# Patient Record
Sex: Male | Born: 1990 | Race: Black or African American | Hispanic: No | Marital: Single | Smoking: Never smoker
Health system: Southern US, Community
[De-identification: ages and names within clinical notes are randomized; demographics above are authoritative.]

## PROBLEM LIST (undated history)

## (undated) HISTORY — PX: EXTERNAL EAR SURGERY: SHX627

---
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
## 2010-05-02 ENCOUNTER — Emergency Department (HOSPITAL_COMMUNITY)
Admission: EM | Admit: 2010-05-02 | Discharge: 2010-05-02 | Discharge status: Against Medical Advice | Payer: Self-pay | Attending: Emergency Medicine | Admitting: Emergency Medicine

## 2010-05-02 DIAGNOSIS — X58XXXA Exposure to other specified factors, initial encounter: Secondary | ICD-10-CM | POA: Insufficient documentation

## 2010-05-02 DIAGNOSIS — S0510XA Contusion of eyeball and orbital tissues, unspecified eye, initial encounter: Secondary | ICD-10-CM | POA: Insufficient documentation

## 2010-07-09 ENCOUNTER — Emergency Department (HOSPITAL_COMMUNITY)
Admission: EM | Admit: 2010-07-09 | Discharge: 2010-07-10 | Disposition: A | Discharge status: Home / Self Care | Payer: Self-pay | Attending: Emergency Medicine | Admitting: Emergency Medicine

## 2010-07-09 ENCOUNTER — Emergency Department (HOSPITAL_COMMUNITY): Payer: Self-pay

## 2010-07-09 DIAGNOSIS — S62309A Unspecified fracture of unspecified metacarpal bone, initial encounter for closed fracture: Secondary | ICD-10-CM | POA: Insufficient documentation

## 2010-07-09 DIAGNOSIS — W2209XA Striking against other stationary object, initial encounter: Secondary | ICD-10-CM | POA: Insufficient documentation

## 2010-07-09 DIAGNOSIS — M7989 Other specified soft tissue disorders: Secondary | ICD-10-CM | POA: Insufficient documentation

## 2016-03-22 ENCOUNTER — Encounter (HOSPITAL_COMMUNITY): Payer: Self-pay | Admitting: Emergency Medicine

## 2016-03-22 ENCOUNTER — Emergency Department (HOSPITAL_COMMUNITY)
Admission: EM | Admit: 2016-03-22 | Discharge: 2016-03-22 | Disposition: A | Discharge status: Home / Self Care | Payer: Self-pay | Attending: Emergency Medicine | Admitting: Emergency Medicine

## 2016-03-22 ENCOUNTER — Emergency Department (HOSPITAL_COMMUNITY): Payer: Self-pay

## 2016-03-22 DIAGNOSIS — S0990XA Unspecified injury of head, initial encounter: Secondary | ICD-10-CM | POA: Insufficient documentation

## 2016-03-22 DIAGNOSIS — Y999 Unspecified external cause status: Secondary | ICD-10-CM | POA: Insufficient documentation

## 2016-03-22 DIAGNOSIS — Y9389 Activity, other specified: Secondary | ICD-10-CM | POA: Insufficient documentation

## 2016-03-22 DIAGNOSIS — Y929 Unspecified place or not applicable: Secondary | ICD-10-CM | POA: Insufficient documentation

## 2016-03-22 DIAGNOSIS — Z87891 Personal history of nicotine dependence: Secondary | ICD-10-CM | POA: Insufficient documentation

## 2016-03-22 NOTE — Discharge Instructions (Signed)
Your neurologic exam is normal several hours after your minor head injury. This may be a minor concussive syndrome but no significant head injury is suspected. Return here if you have any worsening or new symptoms.
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
## 2016-04-02 ENCOUNTER — Emergency Department (HOSPITAL_BASED_OUTPATIENT_CLINIC_OR_DEPARTMENT_OTHER)
Admission: EM | Admit: 2016-04-02 | Discharge: 2016-04-02 | Disposition: A | Discharge status: Home / Self Care | Payer: Self-pay | Attending: Emergency Medicine | Admitting: Emergency Medicine

## 2016-04-02 ENCOUNTER — Emergency Department (HOSPITAL_BASED_OUTPATIENT_CLINIC_OR_DEPARTMENT_OTHER): Payer: Self-pay

## 2016-04-02 ENCOUNTER — Encounter (HOSPITAL_BASED_OUTPATIENT_CLINIC_OR_DEPARTMENT_OTHER): Payer: Self-pay | Admitting: Emergency Medicine

## 2016-04-02 DIAGNOSIS — Y9355 Activity, bike riding: Secondary | ICD-10-CM | POA: Insufficient documentation

## 2016-04-02 DIAGNOSIS — Y929 Unspecified place or not applicable: Secondary | ICD-10-CM | POA: Insufficient documentation

## 2016-04-02 DIAGNOSIS — W228XXA Striking against or struck by other objects, initial encounter: Secondary | ICD-10-CM | POA: Insufficient documentation

## 2016-04-02 DIAGNOSIS — Y999 Unspecified external cause status: Secondary | ICD-10-CM | POA: Insufficient documentation

## 2016-04-02 DIAGNOSIS — F17290 Nicotine dependence, other tobacco product, uncomplicated: Secondary | ICD-10-CM | POA: Insufficient documentation

## 2016-04-02 DIAGNOSIS — S93602A Unspecified sprain of left foot, initial encounter: Secondary | ICD-10-CM | POA: Insufficient documentation

## 2016-04-02 DIAGNOSIS — F17220 Nicotine dependence, chewing tobacco, uncomplicated: Secondary | ICD-10-CM | POA: Insufficient documentation

## 2016-04-02 MED ORDER — IBUPROFEN 600 MG PO TABS
600.0000 mg | ORAL_TABLET | Freq: Four times a day (QID) | ORAL | 0 refills | Status: DC | PRN
Start: 2016-04-02 — End: 2018-11-30

## 2016-04-02 MED ORDER — IBUPROFEN 400 MG PO TABS
600.0000 mg | ORAL_TABLET | Freq: Once | ORAL | Status: AC
  Administered 2016-04-02: 600 mg via ORAL
  Filled 2016-04-02: qty 1

## 2016-04-02 NOTE — ED Triage Notes (Signed)
Left foot pain x 2 days
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
## 2017-12-28 ENCOUNTER — Emergency Department (HOSPITAL_COMMUNITY)
Admission: EM | Admit: 2017-12-28 | Discharge: 2017-12-28 | Disposition: A | Discharge status: Home / Self Care | Payer: BLUE CROSS/BLUE SHIELD | Source: Home / Self Care | Attending: Emergency Medicine | Admitting: Emergency Medicine

## 2017-12-28 ENCOUNTER — Encounter (HOSPITAL_COMMUNITY): Payer: Self-pay

## 2017-12-28 ENCOUNTER — Other Ambulatory Visit: Payer: Self-pay

## 2017-12-28 ENCOUNTER — Emergency Department (HOSPITAL_COMMUNITY)
Admission: EM | Admit: 2017-12-28 | Discharge: 2017-12-28 | Discharge status: Against Medical Advice | Payer: BLUE CROSS/BLUE SHIELD | Attending: Emergency Medicine | Admitting: Emergency Medicine

## 2017-12-28 ENCOUNTER — Emergency Department (HOSPITAL_COMMUNITY): Payer: BLUE CROSS/BLUE SHIELD

## 2017-12-28 DIAGNOSIS — M545 Low back pain, unspecified: Secondary | ICD-10-CM

## 2017-12-28 DIAGNOSIS — F17290 Nicotine dependence, other tobacco product, uncomplicated: Secondary | ICD-10-CM | POA: Diagnosis not present

## 2017-12-28 DIAGNOSIS — Z5320 Procedure and treatment not carried out because of patient's decision for unspecified reasons: Secondary | ICD-10-CM | POA: Diagnosis not present

## 2017-12-28 MED ORDER — METHOCARBAMOL 500 MG PO TABS: 500.0000 mg | ORAL_TABLET | Freq: Two times a day (BID) | ORAL | 0 refills | Status: DC

## 2017-12-28 MED ORDER — METHOCARBAMOL 500 MG PO TABS
500.0000 mg | ORAL_TABLET | Freq: Once | ORAL | Status: AC
  Administered 2017-12-28: 500 mg via ORAL
  Filled 2017-12-28: qty 1

## 2017-12-28 MED ORDER — DICLOFENAC EPOLAMINE 1.3 % TD PTCH
1.0000 | MEDICATED_PATCH | Freq: Two times a day (BID) | TRANSDERMAL | Status: DC
  Filled 2017-12-28: qty 1

## 2017-12-28 MED ORDER — DICLOFENAC EPOLAMINE 1.3 % TD PTCH: 1.0000 | MEDICATED_PATCH | Freq: Two times a day (BID) | TRANSDERMAL | 0 refills | Status: DC

## 2017-12-28 MED ORDER — PREDNISONE 20 MG PO TABS
60.0000 mg | ORAL_TABLET | ORAL | Status: AC
  Administered 2017-12-28: 60 mg via ORAL
  Filled 2017-12-28: qty 3

## 2017-12-28 MED ORDER — DICLOFENAC EPOLAMINE 1.3 % TD PTCH
1.0000 | MEDICATED_PATCH | Freq: Two times a day (BID) | TRANSDERMAL | Status: DC
  Administered 2017-12-28: 1 via TRANSDERMAL

## 2017-12-28 NOTE — ED Provider Notes (Signed)
[place] EMERGENCY DEPARTMENT Provider Note   CSN: [id number] Arrival date & time: 12/28/17  1642     History   Chief Complaint Chief Complaint  Patient presents with  . Back Pain    HPI Travis Riley is a 27 y.o. male.  HPI  Sense with back pain. He notes that about 1 month ago he had onset, while lifting an object across a table in front of him that was particularly heavy. Since that time he has had pain persistently in his back, initially in the low back left, now throughout the lower back. Symptoms were initially mild, manageable with Tylenol, ibuprofen, but over the past few days in particular the pain is become severe, interfering with ambulation, activity. No abdominal pain, no fever, no incontinence.   History reviewed. No pertinent past medical history.  There are no active problems to display for this patient.   Past Surgical History:  Procedure Laterality Date  . EXTERNAL EAR SURGERY          Home Medications    Prior to Admission medications   Medication Sig Start Date End Date Taking? Authorizing Provider  ibuprofen (ADVIL,MOTRIN) 600 MG tablet Take 1 tablet (600 mg total) by mouth every 6 (six) hours as needed. 04/02/16   Julianne Rice, MD    Family History No family history on file.  Social History Social History   Tobacco Use  . Smoking status: Former Research scientist (life sciences)  . Smokeless tobacco: Current User    Types: Chew, Snuff  Substance Use Topics  . Alcohol use: No  . Drug use: No     Allergies   Patient has no allergy information on record.   Review of Systems Review of Systems  Constitutional:       Per HPI, otherwise negative  HENT:       Per HPI, otherwise negative  Respiratory:       Per HPI, otherwise negative  Cardiovascular:       Per HPI, otherwise negative  Gastrointestinal: Negative for vomiting.  Endocrine:       Negative aside from HPI  Genitourinary:       Neg aside from HPI     Musculoskeletal:       Per HPI, otherwise negative  Skin: Negative.   Neurological: Negative for syncope.     Physical Exam Updated Vital Signs BP 114/75   Pulse 62   Temp 98.4 F (36.9 C) (Oral)   Resp 16   Ht 6\' 1"  (1.854 m)   Wt 81.6 kg   SpO2 97%   BMI 23.75 kg/m   Physical Exam  Constitutional: He is oriented to person, place, and time. He appears well-developed. No distress.  HENT:  Head: Normocephalic and atraumatic.  Eyes: Conjunctivae and EOM are normal.  Cardiovascular: Normal rate and regular rhythm.  Pulmonary/Chest: Effort normal. No stridor. No respiratory distress.  Abdominal: He exhibits no distension.  Musculoskeletal: He exhibits no edema.  No appreciable deformity lower back tenderness is worse in the left lower back than right.  Neurological: He is alert and oriented to person, place, and time. He displays no atrophy and no tremor. He exhibits normal muscle tone. He displays no seizure activity. Coordination normal.  Skin: Skin is warm and dry.  Psychiatric: He has a normal mood and affect.  Nursing note and vitals reviewed.    ED Treatments / Results   Radiology No results found.  Procedures Procedures (including critical care time)  Medications Ordered  in ED Medications  diclofenac (FLECTOR) 1.3 % 1 patch (has no administration in time range)  predniSONE (DELTASONE) tablet 60 mg (60 mg Oral Given 12/28/17 1707)     Initial Impression / Assessment and Plan / ED Course  I have reviewed the triage vital signs and the nursing notes.  Pertinent labs & imaging results that were available during my care of the patient were reviewed by me and considered in my medical decision making (see chart for details).     7:07 PM Patient eloped prior to receiving his results. His physical exam was generally reassuring, there is low suspicion for life-threatening disease, but given his history of new back pain, x-ray was indicated.  On x-ray was  reassuring, patient likely has musculoskeletal strain He and I previously discussed appropriate management at home, but he left prior to receiving his discharge instructions or results from his x-ray.   Final Clinical Impressions(s) / ED Diagnoses  Acute left-sided low back pain without sciatica   Carmin Muskrat, MD 12/28/17 Einar Crow

## 2017-12-28 NOTE — ED Triage Notes (Signed)
Pt was put in ED lobby after xray earlier today and no staff was aware that this pt was there. Staff looked for pt and could not find him so he was discharged out as an elopement. Pt here for back pain x1 month

## 2017-12-28 NOTE — ED Provider Notes (Signed)
27 y.o. M who presented for evaluation of back pain x1 month.  He reports it began after he lifted an object that was heavy. Denies fevers, weight loss, numbness/weakness of upper and lower extremities, bowel/bladder incontinence, saddle anesthesia, history of back surgery, history of IVDA.   Patient had been seen earlier by previous provider.  He had gotten x-rays at that time but never returned back to his spot in the ER.  It was assumed that he had eloped from the ED.  Patient had actually been waiting in the lobby.  Please see note from previous provider for full history/physical.  I discussed x-ray results with patient.  Suspect this is most likely musculoskeletal in nature.  We will plan to send home with Robaxin and diclofenac patches for symptomatic relief.  Patient given [facility] for establishing primary care. Patient had ample opportunity for questions and discussion. All patient's questions were answered with full understanding. Strict return precautions discussed. Patient expresses understanding and agreement to plan.     Volanda Napoleon, PA-C 12/28/17 2215    Fredia Sorrow, MD 12/29/17 0002

## 2017-12-28 NOTE — Discharge Instructions (Signed)
You can take Tylenol or Ibuprofen as directed for pain. You can alternate Tylenol and Ibuprofen every 4 hours. If you take Tylenol at 1pm, then you can take Ibuprofen at 5pm. Then you can take Tylenol again at 9pm.   Take Robaxin as prescribed. This medication will make you drowsy so do not drive or drink alcohol when taking it.  Use the diclofenac patches as directed.  Follow-up with [facility] to establish a primary care doctor if you do not have one.   Return to the Emergency Department immediately for any worsening back pain, neck pain, difficulty walking, numbness/weaknss of your arms or legs, urinary or bowel accidents, fever or any other worsening or concerning symptoms.

## 2017-12-28 NOTE — ED Notes (Signed)
Pt eloped.

## 2017-12-28 NOTE — ED Notes (Signed)
Pt eloped, no where to be found

## 2017-12-28 NOTE — ED Triage Notes (Signed)
Pt reports 1 month lower back pain

## 2017-12-28 NOTE — ED Notes (Signed)
Patient transported to X-ray 

## 2018-11-21 ENCOUNTER — Emergency Department (HOSPITAL_COMMUNITY)
Admission: EM | Admit: 2018-11-21 | Discharge: 2018-11-21 | Discharge status: Against Medical Advice | Payer: BLUE CROSS/BLUE SHIELD

## 2018-11-21 NOTE — ED Notes (Signed)
Unable to locate patient at waiting area multiple times.

## 2018-11-26 ENCOUNTER — Emergency Department (HOSPITAL_COMMUNITY)
Admission: EM | Admit: 2018-11-26 | Discharge: 2018-11-26 | Disposition: A | Discharge status: Home / Self Care | Payer: No Typology Code available for payment source | Attending: Emergency Medicine | Admitting: Emergency Medicine

## 2018-11-26 ENCOUNTER — Other Ambulatory Visit: Payer: Self-pay

## 2018-11-26 ENCOUNTER — Encounter (HOSPITAL_COMMUNITY): Payer: Self-pay

## 2018-11-26 DIAGNOSIS — R2231 Localized swelling, mass and lump, right upper limb: Secondary | ICD-10-CM | POA: Diagnosis not present

## 2018-11-26 DIAGNOSIS — F17220 Nicotine dependence, chewing tobacco, uncomplicated: Secondary | ICD-10-CM | POA: Diagnosis not present

## 2018-11-26 NOTE — ED Triage Notes (Signed)
Pt states he thinks he got bit by something on his right arm 4 days ago. Pt states his right arm is numb and there is a lot of soreness in his right elbow.

## 2018-11-26 NOTE — ED Provider Notes (Signed)
[place] EMERGENCY DEPARTMENT Provider Note   CSN: [id number] Arrival date & time: 11/26/18  1639     History   Chief Complaint Chief Complaint  Patient presents with  . Insect Bite    HPI Travis Riley is a 28 y.o. male presents to the ER for evaluation of a nodule that he noticed on his right forearm 4 days ago.  States initially it was not tender but over the last couple of days it has developed local soreness.  Also reports entire hand is "numb".  States he cannot feel it at all.  He thinks it is related to his job.  He used to just drive a vehicle for work but over the last few weeks his boss has given him a new task and now he is doing heavy lifting and using his arms more frequently during the day.  He is at work today and told his boss that his arm was bothering him.  States his boss told him to "work through it".  He went to lunch and felt worse so he came to the ER for evaluation.  He is right-hand dominant.  Denies any trauma.  Denies IV drug use.  No associated redness, warmth or drainage.  No associated neck pain.  He is asking for 5 days off from work.    HPI  History reviewed. No pertinent past medical history.  There are no active problems to display for this patient.   Past Surgical History:  Procedure Laterality Date  . EXTERNAL EAR SURGERY          Home Medications    Prior to Admission medications   Medication Sig Start Date End Date Taking? Authorizing Provider  diclofenac (FLECTOR) 1.3 % PT[facility] Place 1 patch onto the skin 2 (two) times daily. 12/28/17   Volanda Napoleon, PA-C  ibuprofen (ADVIL,MOTRIN) 600 MG tablet Take 1 tablet (600 mg total) by mouth every 6 (six) hours as needed. 04/02/16   Julianne Rice, MD  methocarbamol (ROBAXIN) 500 MG tablet Take 1 tablet (500 mg total) by mouth 2 (two) times daily. 12/28/17   Volanda Napoleon, PA-C    Family History History reviewed. No pertinent family history.  Social History Social  History   Tobacco Use  . Smoking status: Former Research scientist (life sciences)  . Smokeless tobacco: Current User    Types: Chew, Snuff  Substance Use Topics  . Alcohol use: No  . Drug use: No     Allergies   Patient has no known allergies.   Review of Systems Review of Systems  Skin:       Nodule/lump  Neurological: Positive for numbness.  All other systems reviewed and are negative.    Physical Exam Updated Vital Signs BP 109/79   Pulse 61   Temp 98.2 F (36.8 C) (Oral)   Resp 16   Ht 6' (1.829 m)   Wt 77.1 kg   SpO2 99%   BMI 23.06 kg/m   Physical Exam Constitutional:      Appearance: He is well-developed.  HENT:     Head: Normocephalic.     Nose: Nose normal.  Eyes:     General: Lids are normal.  Neck:     Musculoskeletal: Normal range of motion.     Comments: No midline or paraspinal muscle tenderness.  Full range motion of the neck without pain.  No trapezius tenderness. Cardiovascular:     Rate and Rhythm: Normal rate.     Comments:  1+ radial pulses bilaterally. Pulmonary:     Effort: Pulmonary effort is normal. No respiratory distress.  Musculoskeletal: Normal range of motion.     Comments: Mild tenderness around the right medial epicondyle and space between the epicondyle and olecranon process.  No local erythema, edema, warmth to this area.  Full range of motion of the right wrist, elbow and shoulder without any deficit or pain.  Skin:    Comments: Approximately 2 x 2 cm rubbery, mildly tender easily mobile nodule on palmar aspect of mid right forearm.  No surrounding skin erythema, warmth, fluctuance.  Neurological:     Mental Status: He is alert.     Comments: 5/5 strength with right hand grip, wrist flexion/extension/adduction and abduction, elbow flexion and extension, shoulder abduction and flexion.  Sensation to light touch intact in right medial, ulnar, radial nerve distribution.  Patient gives inconsistent answers with once-two-point discrimination, question  effort.  Psychiatric:        Behavior: Behavior normal.      ED Treatments / Results  Labs (all labs ordered are listed, but only abnormal results are displayed) Labs Reviewed - No data to display  EKG None  Radiology No results found.  Procedures Ultrasound ED Soft Tissue  Date/Time: 11/26/2018 9:28 PM Performed by: Kinnie Feil, PA-C Authorized by: Kinnie Feil, PA-C   Procedure details:    Indications comment:  Nodule   Transverse view:  Visualized   Longitudinal view:  Visualized   Images: archived   Location:    Location: upper extremity     Side:  Right Findings:     no abscess present    no cellulitis present    no foreign body present   (including critical care time)  Medications Ordered in ED Medications - No data to display   Initial Impression / Assessment and Plan / ED Course  I have reviewed the triage vital signs and the nursing notes.  Pertinent labs & imaging results that were available during my care of the patient were reviewed by me and considered in my medical decision making (see chart for details).  Examination of nodule is benign, most consistent with likely lipoma versus cyst.  Bedside ultrasound shows approximately 2 x 1 cm fluid-filled oval lesion without surrounding cobblestoning/cellulitis.  Skin over the nodule is normal without signs of infection.  He denies IV drug use.  Exam is not consistent with abscess, cellulitis.  Compartments are soft.  Pulses and strength is intact.  Highest on DDX is benign nodule, this could be pressing on some distal/superficial nerves causing hand decreased sensation.  His strength is intact.  He reports increased use of right upper extremity and he has left medial elbow pain, distal nerve inflammation also possibility.  He has no associated neck pain.  Recommended symptom management with ice, rest, NSAIDs and activity modification.  Return precautions discussed.  Recommended outpatient follow-up  with general surgery orthopedics as needed.  Final Clinical Impressions(s) / ED Diagnoses   Final diagnoses:  Nodule of skin of right forearm    ED Discharge Orders    None       Arlean Hopping 11/26/18 2130    Quintella Reichert, MD 11/27/18 0030

## 2018-11-26 NOTE — Discharge Instructions (Signed)
You were seen in the ER for nodule in your forearm and decreased sensation in your hand.  Ultrasound showed a fluid-filled nodule but no signs of infection or abscess.  I suspect some of your decreased sensation in your hand may be to inflammation around the nerves in your forearm and elbow, it also could be from the nodule pressing on some nerves in the area.  Rest your right upper extremity over the next 48 to 72 hours.  Ice your elbow and wrist.  Take Tylenol or ibuprofen every 8 hours for the next 2 to 3 days to help with inflammation.  Ultimately, repeat arm and elbow movements can cause continued inflammation of your nerves.  Ideally, you can modify activities at work and at home and or ice as needed afterwards.  Return to the ER if there is complete loss of sensation or weakness to your extremity, redness swelling warmth pain fevers or drainage from the nodule.

## 2018-11-30 ENCOUNTER — Encounter (HOSPITAL_COMMUNITY): Payer: Self-pay | Admitting: *Deleted

## 2018-11-30 ENCOUNTER — Emergency Department (HOSPITAL_COMMUNITY): Payer: No Typology Code available for payment source

## 2018-11-30 ENCOUNTER — Emergency Department (HOSPITAL_COMMUNITY)
Admission: EM | Admit: 2018-11-30 | Discharge: 2018-11-30 | Disposition: A | Discharge status: Home / Self Care | Payer: No Typology Code available for payment source | Attending: Emergency Medicine | Admitting: Emergency Medicine

## 2018-11-30 ENCOUNTER — Other Ambulatory Visit: Payer: Self-pay

## 2018-11-30 DIAGNOSIS — M79631 Pain in right forearm: Secondary | ICD-10-CM | POA: Insufficient documentation

## 2018-11-30 DIAGNOSIS — R2231 Localized swelling, mass and lump, right upper limb: Secondary | ICD-10-CM | POA: Diagnosis not present

## 2018-11-30 MED ORDER — IBUPROFEN 400 MG PO TABS
400.0000 mg | ORAL_TABLET | Freq: Once | ORAL | Status: AC
  Administered 2018-11-30: 02:00:00 400 mg via ORAL
  Filled 2018-11-30: qty 1

## 2018-11-30 MED ORDER — HYDROCODONE-ACETAMINOPHEN 5-325 MG PO TABS: 1.0000 | ORAL_TABLET | Freq: Four times a day (QID) | ORAL | 0 refills | Status: DC | PRN

## 2018-11-30 NOTE — Discharge Instructions (Signed)
Be sure to take off the splint and range her wrist and elbow every day. Call the hand specialist today Use the pain medicine as needed.

## 2018-11-30 NOTE — ED Provider Notes (Signed)
[facility] EMERGENCY DEPARTMENT Provider Note   CSN: [id number] Arrival date & time: 11/30/18  0035     History   Chief Complaint Chief Complaint  Patient presents with  . Hand Pain    HPI Travis Riley is a 28 y.o. male.     The history is provided by the patient.  Hand Pain This is a new problem. The current episode started more than 1 week ago. The problem occurs daily. The problem has been gradually worsening. Pertinent negatives include no chest pain and no shortness of breath. The symptoms are aggravated by bending. Nothing relieves the symptoms. Treatments tried: Ibuprofen. The treatment provided no relief.  Patient presents for continued pain in right forearm and right hand.  Patient was seen in the ER at [facility] on November 6 He thought he had an insect bite to his forearm because he had a small nodule.  Since that time the pain has worsened and he has numbness from his elbow to his right hand.  No fevers or vomiting.  No traumatic injury.  He does use his hands a lot at work.   PMH-none Soc hx  - denies IVDA Past Surgical History:  Procedure Laterality Date  . EXTERNAL EAR SURGERY          Home Medications    Prior to Admission medications   Not on File    Family History History reviewed. No pertinent family history.  Social History Social History   Tobacco Use  . Smoking status: Former Research scientist (life sciences)  . Smokeless tobacco: Current User    Types: Chew, Snuff  Substance Use Topics  . Alcohol use: No  . Drug use: No     Allergies   Patient has no known allergies.   Review of Systems Review of Systems  Constitutional: Negative for fever.  Respiratory: Negative for shortness of breath.   Cardiovascular: Negative for chest pain.  Gastrointestinal: Negative for vomiting.  Musculoskeletal: Positive for myalgias. Negative for arthralgias.  Skin: Positive for wound.  Neurological: Positive for numbness.  All other systems reviewed and are  negative.    Physical Exam Updated Vital Signs BP 118/79 (BP Location: Left Arm)   Pulse 80   Temp 98.9 F (37.2 C) (Oral)   Resp 18   Ht 1.829 m (6')   Wt 77.1 kg   SpO2 98%   BMI 23.06 kg/m   Physical Exam CONSTITUTIONAL: Well developed/well nourished HEAD: Normocephalic/atraumatic EYES: EOMI ENMT: Mucous membranes moist NECK: supple no meningeal signs CV: S1/S2 noted, no murmurs/rubs/gallops noted LUNGS: Lungs are clear to auscultation bilaterally, no apparent distress ABDOMEN: soft NEURO: Pt is awake/alert/appropriate, moves all extremitiesx4.  No facial droop.   EXTREMITIES: pulses normal/equal, full ROM, mild tenderness noted over nodular area on right volar forearm.  There is no crepitus, no bruising, no erythema.  No fluctuance.  See photo below Patient has significant tenderness over the ulnar aspect of the right forearm.  Patient has tenderness throughout his right hand.  No significant edema noted extremity There is no streaking noted to the forearm. He reports due to pain he is unable to make a fist with his right hand There is no sensory deficit to the right upper extremity.  Distal pulses are equal intact. No crepitus SKIN: warm, color normal PSYCH: no abnormalities of mood noted, alert and oriented to situation    Patient gave verbal permission to utilize photo for medical documentation only The image was not stored on any personal  device   ED Treatments / Results  Labs (all labs ordered are listed, but only abnormal results are displayed) Labs Reviewed - No data to display  EKG None  Radiology Dg Forearm Right  Result Date: 11/30/2018 CLINICAL DATA:  Pain following heavy lifting, initial encounter EXAM: RIGHT FOREARM - 2 VIEW COMPARISON:  None. FINDINGS: There is no evidence of fracture or other focal bone lesions. Soft tissues are unremarkable. IMPRESSION: No acute abnormality noted. Electronically Signed   By: Inez Catalina M.D.   On: 11/30/2018  01:36   Dg Hand Complete Right  Result Date: 11/30/2018 CLINICAL DATA:  Hand numbness following heavy lifting, initial encounter EXAM: RIGHT HAND - COMPLETE 3+ VIEW COMPARISON:  07/09/2010 FINDINGS: Healed fifth metacarpal fracture is noted. No acute fracture or dislocation is seen. No soft tissue abnormality is noted. IMPRESSION: Healed fifth metacarpal fracture with angulation. No acute abnormality noted. Electronically Signed   By: Inez Catalina M.D.   On: 11/30/2018 01:38    Procedures Procedures   Medications Ordered in ED Medications  ibuprofen (ADVIL) tablet 400 mg (400 mg Oral Given 11/30/18 0138)     Initial Impression / Assessment and Plan / ED Course  I have reviewed the triage vital signs and the nursing notes.  Pertinent  imaging results that were available during my care of the patient were reviewed by me and considered in my medical decision making (see chart for details).        1:13 AM Patient presents for repeat ER visit due to continued pain in the right forearm.  Evaluation on November 6 revealed that this was likely a nodule, as ultrasound study did not reveal any signs of an abscess.  No obvious foreign body Felt to be nodule versus lipoma at that time. On My exam he appears to have pain out of proportion to exam Due to repeat visit, will obtain x-ray imaging of the forearm and hand. Patient will need prompt follow-up with hand surgeon 2:16 AM Patient stable.  Velcro splint applied.  X-rays negative for foreign body or bony injury No signs of any infectious etiology on exam. Head movement is limited due to pain, but no sensory deficit. Patient has adequate vascular supply to the extremities Patient be referred to hand surgery, will refer back to the one that was on-call in his initial visit on 11/6 Short course of pain medicine has been prescribed Final Clinical Impressions(s) / ED Diagnoses   Final diagnoses:  Right forearm pain  Nodule of skin of  right forearm    ED Discharge Orders    None       Ripley Fraise, MD 11/30/18 [phone]

## 2018-11-30 NOTE — ED Triage Notes (Signed)
Pt states he injured his right hand at work last week and has been having hand numbness; pt states he was seen at [facility] and told to see a hand surgeon but pt has not been able to find one and states the pain has gotten worse; pt has a lump on his right forearm

## 2019-05-27 ENCOUNTER — Other Ambulatory Visit: Payer: Self-pay | Admitting: Orthopedic Surgery

## 2019-05-27 DIAGNOSIS — R2231 Localized swelling, mass and lump, right upper limb: Secondary | ICD-10-CM

## 2019-06-22 ENCOUNTER — Ambulatory Visit
Admission: RE | Admit: 2019-06-22 | Discharge: 2019-06-22 | Disposition: A | Discharge status: Home / Self Care | Payer: No Typology Code available for payment source | Source: Ambulatory Visit | Attending: Orthopedic Surgery | Admitting: Orthopedic Surgery

## 2019-06-22 DIAGNOSIS — R2231 Localized swelling, mass and lump, right upper limb: Secondary | ICD-10-CM

## 2019-07-28 ENCOUNTER — Other Ambulatory Visit: Payer: Self-pay | Admitting: Orthopedic Surgery

## 2019-08-17 ENCOUNTER — Encounter (HOSPITAL_BASED_OUTPATIENT_CLINIC_OR_DEPARTMENT_OTHER): Payer: Self-pay | Admitting: Orthopedic Surgery

## 2019-08-17 ENCOUNTER — Other Ambulatory Visit: Payer: Self-pay

## 2019-08-22 ENCOUNTER — Other Ambulatory Visit (HOSPITAL_COMMUNITY): Admission: RE | Admit: 2019-08-22 | Payer: Self-pay | Source: Ambulatory Visit

## 2019-08-24 ENCOUNTER — Other Ambulatory Visit (HOSPITAL_COMMUNITY)
Admission: RE | Admit: 2019-08-24 | Discharge: 2019-08-24 | Disposition: A | Discharge status: Home / Self Care | Payer: HRSA Program | Source: Ambulatory Visit | Attending: Orthopedic Surgery | Admitting: Orthopedic Surgery

## 2019-08-24 DIAGNOSIS — Z01812 Encounter for preprocedural laboratory examination: Secondary | ICD-10-CM | POA: Diagnosis present

## 2019-08-24 DIAGNOSIS — Z20822 Contact with and (suspected) exposure to covid-19: Secondary | ICD-10-CM | POA: Diagnosis not present

## 2019-08-24 LAB — SARS CORONAVIRUS 2 (TAT 6-24 HRS): SARS Coronavirus 2: NEGATIVE

## 2019-08-25 ENCOUNTER — Encounter (HOSPITAL_BASED_OUTPATIENT_CLINIC_OR_DEPARTMENT_OTHER)
Admission: RE | Disposition: A | Discharge status: Home / Self Care | Payer: Self-pay | Source: Home / Self Care | Attending: Orthopedic Surgery

## 2019-08-25 ENCOUNTER — Encounter (HOSPITAL_BASED_OUTPATIENT_CLINIC_OR_DEPARTMENT_OTHER): Payer: Self-pay | Admitting: Orthopedic Surgery

## 2019-08-25 ENCOUNTER — Ambulatory Visit (HOSPITAL_BASED_OUTPATIENT_CLINIC_OR_DEPARTMENT_OTHER): Payer: No Typology Code available for payment source | Admitting: Anesthesiology

## 2019-08-25 ENCOUNTER — Ambulatory Visit (HOSPITAL_BASED_OUTPATIENT_CLINIC_OR_DEPARTMENT_OTHER)
Admission: RE | Admit: 2019-08-25 | Discharge: 2019-08-25 | Disposition: A | Discharge status: Home / Self Care | Payer: No Typology Code available for payment source | Attending: Orthopedic Surgery | Admitting: Orthopedic Surgery

## 2019-08-25 ENCOUNTER — Other Ambulatory Visit: Payer: Self-pay

## 2019-08-25 DIAGNOSIS — F17200 Nicotine dependence, unspecified, uncomplicated: Secondary | ICD-10-CM | POA: Insufficient documentation

## 2019-08-25 DIAGNOSIS — D2111 Benign neoplasm of connective and other soft tissue of right upper limb, including shoulder: Secondary | ICD-10-CM | POA: Insufficient documentation

## 2019-08-25 DIAGNOSIS — G5601 Carpal tunnel syndrome, right upper limb: Secondary | ICD-10-CM | POA: Diagnosis present

## 2019-08-25 DIAGNOSIS — G5621 Lesion of ulnar nerve, right upper limb: Secondary | ICD-10-CM | POA: Insufficient documentation

## 2019-08-25 HISTORY — PX: ULNAR NERVE TRANSPOSITION: SHX2595

## 2019-08-25 HISTORY — PX: CARPAL TUNNEL RELEASE: SHX101

## 2019-08-25 HISTORY — PX: MASS EXCISION: SHX2000

## 2019-08-25 SURGERY — EXCISION MASS
Anesthesia: Monitor Anesthesia Care | Site: Wrist | Laterality: Right

## 2019-08-25 MED ORDER — ONDANSETRON HCL 4 MG/2ML IJ SOLN
INTRAMUSCULAR | Status: AC
  Filled 2019-08-25: qty 2

## 2019-08-25 MED ORDER — FENTANYL CITRATE (PF) 100 MCG/2ML IJ SOLN
INTRAMUSCULAR | Status: AC
  Filled 2019-08-25: qty 2

## 2019-08-25 MED ORDER — MIDAZOLAM HCL 5 MG/5ML IJ SOLN
INTRAMUSCULAR | Status: DC | PRN
  Administered 2019-08-25: 1 mg via INTRAVENOUS

## 2019-08-25 MED ORDER — DEXAMETHASONE SODIUM PHOSPHATE 10 MG/ML IJ SOLN
INTRAMUSCULAR | Status: DC | PRN
Start: 2019-08-25 — End: 2019-08-25
  Administered 2019-08-25: 5 mg

## 2019-08-25 MED ORDER — MIDAZOLAM HCL 2 MG/2ML IJ SOLN
2.0000 mg | Freq: Once | INTRAMUSCULAR | Status: AC
  Administered 2019-08-25: 2 mg via INTRAVENOUS

## 2019-08-25 MED ORDER — SUCCINYLCHOLINE CHLORIDE 200 MG/10ML IV SOSY
PREFILLED_SYRINGE | INTRAVENOUS | Status: AC
  Filled 2019-08-25: qty 10

## 2019-08-25 MED ORDER — FENTANYL CITRATE (PF) 100 MCG/2ML IJ SOLN
100.0000 ug | Freq: Once | INTRAMUSCULAR | Status: AC
  Administered 2019-08-25: 100 ug via INTRAVENOUS

## 2019-08-25 MED ORDER — ACETAMINOPHEN 500 MG PO TABS
1000.0000 mg | ORAL_TABLET | Freq: Once | ORAL | Status: AC
  Administered 2019-08-25: 1000 mg via ORAL

## 2019-08-25 MED ORDER — ONDANSETRON HCL 4 MG/2ML IJ SOLN
INTRAMUSCULAR | Status: DC | PRN
  Administered 2019-08-25: 4 mg via INTRAVENOUS

## 2019-08-25 MED ORDER — LIDOCAINE 2% (20 MG/ML) 5 ML SYRINGE
INTRAMUSCULAR | Status: AC
  Filled 2019-08-25: qty 5

## 2019-08-25 MED ORDER — FENTANYL CITRATE (PF) 100 MCG/2ML IJ SOLN
INTRAMUSCULAR | Status: DC | PRN
  Administered 2019-08-25: 50 ug via INTRAVENOUS

## 2019-08-25 MED ORDER — HYDROCODONE-ACETAMINOPHEN 5-325 MG PO TABS: ORAL_TABLET | ORAL | 0 refills | Status: DC

## 2019-08-25 MED ORDER — DIPHENHYDRAMINE HCL 50 MG/ML IJ SOLN
INTRAMUSCULAR | Status: AC
  Filled 2019-08-25: qty 1

## 2019-08-25 MED ORDER — FENTANYL CITRATE (PF) 100 MCG/2ML IJ SOLN: 25.0000 ug | INTRAMUSCULAR | Status: DC | PRN

## 2019-08-25 MED ORDER — PROPOFOL 500 MG/50ML IV EMUL
INTRAVENOUS | Status: DC | PRN
  Administered 2019-08-25: 125 ug/kg/min via INTRAVENOUS

## 2019-08-25 MED ORDER — MIDAZOLAM HCL 2 MG/2ML IJ SOLN
INTRAMUSCULAR | Status: AC
  Filled 2019-08-25: qty 2

## 2019-08-25 MED ORDER — ACETAMINOPHEN 500 MG PO TABS
ORAL_TABLET | ORAL | Status: AC
  Filled 2019-08-25: qty 2

## 2019-08-25 MED ORDER — CEFAZOLIN SODIUM-DEXTROSE 2-4 GM/100ML-% IV SOLN
2.0000 g | INTRAVENOUS | Status: AC
  Administered 2019-08-25: 2 g via INTRAVENOUS

## 2019-08-25 MED ORDER — CEFAZOLIN SODIUM-DEXTROSE 2-4 GM/100ML-% IV SOLN
INTRAVENOUS | Status: AC
  Filled 2019-08-25: qty 100

## 2019-08-25 MED ORDER — LACTATED RINGERS IV SOLN: INTRAVENOUS | Status: DC

## 2019-08-25 MED ORDER — EPHEDRINE 5 MG/ML INJ
INTRAVENOUS | Status: AC
  Filled 2019-08-25: qty 10

## 2019-08-25 MED ORDER — DIPHENHYDRAMINE HCL 50 MG/ML IJ SOLN
INTRAMUSCULAR | Status: DC | PRN
Start: 2019-08-25 — End: 2019-08-25
  Administered 2019-08-25: 6.25 mg via INTRAVENOUS

## 2019-08-25 MED ORDER — ROPIVACAINE HCL 5 MG/ML IJ SOLN
INTRAMUSCULAR | Status: DC | PRN
  Administered 2019-08-25: 20 mL via PERINEURAL

## 2019-08-25 MED ORDER — PHENYLEPHRINE 40 MCG/ML (10ML) SYRINGE FOR IV PUSH (FOR BLOOD PRESSURE SUPPORT)
PREFILLED_SYRINGE | INTRAVENOUS | Status: AC
  Filled 2019-08-25: qty 10

## 2019-08-25 MED ORDER — PROPOFOL 500 MG/50ML IV EMUL
INTRAVENOUS | Status: AC
  Filled 2019-08-25: qty 50

## 2019-08-25 SURGICAL SUPPLY — 72 items
APL PRP STRL LF DISP 70% ISPRP (MISCELLANEOUS) ×3
APL SKNCLS STERI-STRIP NONHPOA (GAUZE/BANDAGES/DRESSINGS)
BENZOIN TINCTURE PRP APPL 2/3 (GAUZE/BANDAGES/DRESSINGS) IMPLANT
BLADE MINI RND TIP GREEN BEAV (BLADE) IMPLANT
BLADE SURG 15 STRL LF DISP TIS (BLADE) ×6 IMPLANT
BLADE SURG 15 STRL SS (BLADE) ×10
BNDG CMPR 9X4 STRL LF SNTH (GAUZE/BANDAGES/DRESSINGS) ×3
BNDG COHESIVE 1X5 TAN STRL LF (GAUZE/BANDAGES/DRESSINGS) IMPLANT
BNDG COHESIVE 2X5 TAN STRL LF (GAUZE/BANDAGES/DRESSINGS) IMPLANT
BNDG CONFORM 2 STRL LF (GAUZE/BANDAGES/DRESSINGS) IMPLANT
BNDG ELASTIC 2X5.8 VLCR STR LF (GAUZE/BANDAGES/DRESSINGS) IMPLANT
BNDG ELASTIC 3X5.8 VLCR STR LF (GAUZE/BANDAGES/DRESSINGS) ×10 IMPLANT
BNDG ELASTIC 4X5.8 VLCR STR LF (GAUZE/BANDAGES/DRESSINGS) ×5 IMPLANT
BNDG ESMARK 4X9 LF (GAUZE/BANDAGES/DRESSINGS) ×5 IMPLANT
BNDG GAUZE 1X2.1 STRL (MISCELLANEOUS) IMPLANT
BNDG GAUZE ELAST 4 BULKY (GAUZE/BANDAGES/DRESSINGS) ×5 IMPLANT
BNDG PLASTER X FAST 3X3 WHT LF (CAST SUPPLIES) IMPLANT
BNDG PLSTR 9X3 FST ST WHT (CAST SUPPLIES)
CHLORAPREP W/TINT 26 (MISCELLANEOUS) ×5 IMPLANT
CLOSURE WOUND 1/2 X4 (GAUZE/BANDAGES/DRESSINGS)
CORD BIPOLAR FORCEPS 12FT (ELECTRODE) ×5 IMPLANT
COVER BACK TABLE 60X90IN (DRAPES) ×5 IMPLANT
COVER MAYO STAND STRL (DRAPES) ×5 IMPLANT
COVER WAND RF STERILE (DRAPES) IMPLANT
CUFF TOURN SGL QUICK 18X3 (MISCELLANEOUS) ×5 IMPLANT
CUFF TOURN SGL QUICK 18X4 (TOURNIQUET CUFF) ×5 IMPLANT
DECANTER SPIKE VIAL GLASS SM (MISCELLANEOUS) IMPLANT
DRAPE EXTREMITY T 121X128X90 (DISPOSABLE) ×5 IMPLANT
DRAPE SURG 17X23 STRL (DRAPES) ×5 IMPLANT
DRSG PAD ABDOMINAL 8X10 ST (GAUZE/BANDAGES/DRESSINGS) ×5 IMPLANT
GAUZE 4X4 16PLY RFD (DISPOSABLE) IMPLANT
GAUZE SPONGE 4X4 12PLY STRL (GAUZE/BANDAGES/DRESSINGS) ×5 IMPLANT
GAUZE XEROFORM 1X8 LF (GAUZE/BANDAGES/DRESSINGS) ×5 IMPLANT
GLOVE BIO SURGEON STRL SZ7.5 (GLOVE) ×5 IMPLANT
GLOVE BIOGEL PI IND STRL 8 (GLOVE) ×3 IMPLANT
GLOVE BIOGEL PI IND STRL 8.5 (GLOVE) ×3 IMPLANT
GLOVE BIOGEL PI INDICATOR 8 (GLOVE) ×2
GLOVE BIOGEL PI INDICATOR 8.5 (GLOVE) ×2
GLOVE SURG ORTHO 8.0 STRL STRW (GLOVE) ×5 IMPLANT
GOWN STRL REUS W/ TWL LRG LVL3 (GOWN DISPOSABLE) ×3 IMPLANT
GOWN STRL REUS W/TWL LRG LVL3 (GOWN DISPOSABLE) ×5
GOWN STRL REUS W/TWL XL LVL3 (GOWN DISPOSABLE) ×10 IMPLANT
NDL HYPO 25X1 1.5 SAFETY (NEEDLE) ×2 IMPLANT
NEEDLE HYPO 25X1 1.5 SAFETY (NEEDLE) ×5 IMPLANT
NS IRRIG 1000ML POUR BTL (IV SOLUTION) ×5 IMPLANT
PACK BASIN DAY SURGERY FS (CUSTOM PROCEDURE TRAY) ×5 IMPLANT
PAD CAST 3X4 CTTN HI CHSV (CAST SUPPLIES) ×3 IMPLANT
PAD CAST 4YDX4 CTTN HI CHSV (CAST SUPPLIES) ×3 IMPLANT
PADDING CAST ABS 4INX4YD NS (CAST SUPPLIES) ×2
PADDING CAST ABS COTTON 4X4 ST (CAST SUPPLIES) ×3 IMPLANT
PADDING CAST COTTON 3X4 STRL (CAST SUPPLIES) ×5
PADDING CAST COTTON 4X4 STRL (CAST SUPPLIES) ×5
SLEEVE SCD COMPRESS KNEE MED (MISCELLANEOUS) ×5 IMPLANT
SPLINT FAST PLASTER 5X30 (CAST SUPPLIES)
SPLINT PLASTER CAST FAST 5X30 (CAST SUPPLIES) IMPLANT
SPLINT PLASTER CAST XFAST 3X15 (CAST SUPPLIES) IMPLANT
SPLINT PLASTER XTRA FASTSET 3X (CAST SUPPLIES)
STOCKINETTE 4X48 STRL (DRAPES) ×5 IMPLANT
STRIP CLOSURE SKIN 1/2X4 (GAUZE/BANDAGES/DRESSINGS) IMPLANT
SUT ETHILON 3 0 PS 1 (SUTURE) IMPLANT
SUT ETHILON 4 0 P 3 18 (SUTURE) ×5 IMPLANT
SUT ETHILON 4 0 PS 2 18 (SUTURE) ×11 IMPLANT
SUT ETHILON 5 0 P 3 18 (SUTURE)
SUT NYLON ETHILON 5-0 P-3 1X18 (SUTURE) IMPLANT
SUT VIC AB 2-0 SH 27 (SUTURE) ×5
SUT VIC AB 2-0 SH 27XBRD (SUTURE) ×3 IMPLANT
SUT VIC AB 4-0 P2 18 (SUTURE) IMPLANT
SUT VICRYL 4-0 PS2 18IN ABS (SUTURE) ×3 IMPLANT
SYR BULB EAR ULCER 3OZ GRN STR (SYRINGE) ×5 IMPLANT
SYR CONTROL 10ML LL (SYRINGE) ×5 IMPLANT
TOWEL GREEN STERILE FF (TOWEL DISPOSABLE) ×10 IMPLANT
UNDERPAD 30X36 HEAVY ABSORB (UNDERPADS AND DIAPERS) ×5 IMPLANT

## 2019-08-25 NOTE — Anesthesia Procedure Notes (Signed)
Anesthesia Regional Block: Supraclavicular block   Pre-Anesthetic Checklist: ,, timeout performed, Correct Patient, Correct Site, Correct Laterality, Correct Procedure, Correct Position, site marked, Risks and benefits discussed,  Surgical consent,  Pre-op evaluation,  At surgeon's request and post-op pain management  Laterality: Right  Prep: Maximum Sterile Barrier Precautions used, chloraprep       Needles:  Injection technique: Single-shot  Needle Type: Echogenic Stimulator Needle     Needle Length: 4cm  Needle Gauge: 22     Additional Needles:   Procedures:,,,, ultrasound used (permanent image in chart),,,,  Narrative:  Start time: 08/25/2019 10:07 AM End time: 08/25/2019 10:17 AM Injection made incrementally with aspirations every 5 mL.  Performed by: Personally  Anesthesiologist: Freddrick March, MD  Additional Notes: Monitors applied. No increased pain on injection. No increased resistance to injection. Injection made in 5cc increments. Good needle visualization. Patient tolerated procedure well.

## 2019-08-25 NOTE — H&P (Signed)
Travis Riley is an 29 y.o. male.   Chief Complaint: right hand numbness and forearm masses HPI: 29 yo male with numbness and tingling right hand and masses in the forearm.  Positive nerve conduction studies.  He wishes to have right carpal tunnel release and ulnar nerve decompression with possible transposition and removal of forearm masses.  Allergies: No Known Allergies  History reviewed. No pertinent past medical history.  Past Surgical History:  Procedure Laterality Date   EXTERNAL EAR SURGERY      Family History: History reviewed. No pertinent family history.  Social History:   reports that he has been smoking. His smokeless tobacco use includes chew and snuff. He reports that he does not drink alcohol and does not use drugs.  Medications: No medications prior to admission.    Results for orders placed or performed during the hospital encounter of 08/24/19 (from the past 48 hour(s))  SARS CORONAVIRUS 2 (TAT 6-24 HRS) Nasopharyngeal Nasopharyngeal Swab     Status: None   Collection Time: 08/24/19  9:00 AM   Specimen: Nasopharyngeal Swab  Result Value Ref Range   SARS Coronavirus 2 NEGATIVE NEGATIVE    Comment: (NOTE) SARS-CoV-2 target nucleic acids are NOT DETECTED.  The SARS-CoV-2 RNA is generally detectable in upper and lower respiratory specimens during the acute phase of infection. Negative results do not preclude SARS-CoV-2 infection, do not rule out co-infections with other pathogens, and should not be used as the sole basis for treatment or other patient management decisions. Negative results must be combined with clinical observations, patient history, and epidemiological information. The expected result is Negative.  Fact Sheet for Patients: SugarRoll.be  Fact Sheet for Healthcare Providers: https://www.woods-mathews.com/  This test is not yet approved or cleared by the [place] FDA and  has been authorized  for detection and/or diagnosis of SARS-CoV-2 by FDA under an Emergency Use Authorization (EUA). This EUA will remain  in effect (meaning this test can be used) for the duration of the COVID-19 declaration under Se ction 564(b)(1) of the Act, 21 U.S.C. section 360bbb-3(b)(1), unless the authorization is terminated or revoked sooner.  Performed at [facility], [place] [address], [place], [place] [address]     No results found.   A comprehensive review of systems was negative.  Height 6\' 2"  (1.88 m), weight 81.6 kg.  General appearance: alert, cooperative and appears stated age Head: Normocephalic, without obvious abnormality, atraumatic Neck: supple, symmetrical, trachea midline Cardio: regular rate and rhythm Resp: clear to auscultation bilaterally Extremities: Intact sensation and capillary refill all digits.  +epl/fpl/io.  No wounds.  Pulses: 2+ and symmetric Skin: Skin color, texture, turgor normal. No rashes or lesions Neurologic: Grossly normal Incision/Wound: none  Assessment/Plan Right carpal tunnel syndrome, ulnar nerve compression at elbow, forearm masses x 2.  Non operative and operative treatment options have been discussed with the patient and patient wishes to proceed with operative treatment. Risks, benefits, and alternatives of surgery have been discussed and the patient agrees with the plan of care.   Leanora Cover 08/25/2019, 8:38 AM

## 2019-08-25 NOTE — Op Note (Signed)
NAME: Travis Riley MEDICAL RECORD NO: [id number] DATE OF BIRTH: [birth year] FACILITY: [facility] LOCATION: [facility] PHYSICIAN: Tennis Must, MD   OPERATIVE REPORT   DATE OF PROCEDURE: 08/25/19    PREOPERATIVE DIAGNOSIS:   Right carpal tunnel syndrome, right ulnar nerve compression at the elbow, right forearm mass   POSTOPERATIVE DIAGNOSIS:  Right carpal tunnel syndrome, right ulnar nerve compression at the elbow, right forearm mass   PROCEDURE:   1.  Right carpal tunnel release 2.  Right ulnar nerve decompression at the elbow 3.  Excision of fascial mass right forearm 1 cm   SURGEON:  Leanora Cover, M.D.   ASSISTANT: Daryll Brod, MD   ANESTHESIA:  Regional with sedation   INTRAVENOUS FLUIDS:  Per anesthesia flow sheet.   ESTIMATED BLOOD LOSS:  Minimal.   COMPLICATIONS:  None.   SPECIMENS:   Right forearm mass to pathology   TOURNIQUET TIME:   Right arm: 51 minutes at 250 mmHg   DISPOSITION:  Stable to PACU.   INDICATIONS: 29 year old male with numbness and tingling in the right hand.  He has positive nerve conduction studies.  He wishes to have right carpal tunnel release and ulnar nerve decompression at the elbow.  He also has a mass in the volar forearm that is painful and bothersome.  He wishes to have this removed. Risks, benefits and alternatives of surgery were discussed including the risks of blood loss, infection, damage to nerves, vessels, tendons, ligaments, bone for surgery, need for additional surgery, complications with wound healing, continued pain, stiffness.  He voiced understanding of these risks and elected to proceed.  OPERATIVE COURSE:  After being identified preoperatively by myself,  the patient and I agreed on the procedure and site of the procedure.  The surgical site was marked.  Surgical consent had been signed. He was given IV antibiotics as preoperative antibiotic prophylaxis. He was transferred to the operating room and placed on  the operating table in supine position with the Right upper extremity on an arm board.  Sedation was induced by the anesthesiologist. A regional block had been performed by anesthesia in preoperative holding.   Right upper extremity was prepped and draped in normal sterile orthopedic fashion.  A surgical pause was performed between the surgeons, anesthesia, and operating room staff and all were in agreement as to the patient, procedure, and site of procedure.  Tourniquet at the proximal aspect of the extremity was inflated to 250 mmHg after exsanguination of the arm with an Esmarch bandage.   Incision was made over the transverse carpal ligament and carried into the subcutaneous tissues by spreading technique.  Bipolar electrocautery was used to obtain hemostasis.  The palmar fascia was sharply incised.  The transverse carpal ligament was identified and sharply incised.  It was incised distally first.  The flexor tendons were identified.  The flexor tendon to the ring finger was identified and retracted radially.  The transverse carpal ligament was then incised proximally.  Scissors were used to split the distal aspect of the volar antebrachial fascia.  A finger was placed into the wound to ensure complete decompression, which was the case.  The nerve was examined.  It was flattened and hyperemic.  The motor branch was identified and was intact.  The wound was copiously irrigated with sterile saline.  It was then closed with 4-0 nylon in a horizontal mattress fashion.  Incision was then made at the medial side of the elbow.  This is carried  in subcutaneous tissues by spreading technique.  Bipolar electrocautery was used to obtain hemostasis.  Cutaneous branches of nerve were identified and protected throughout the case.  The ulnar nerve was identified proximal to Osborne's ligament.  It was then carefully freed up and protected while Osborne's ligament was divided.  The fascia over the FCU muscle was divided and  the FCU heads spread.  The investing fascia surrounding nerve was released.  The motor branch to the FCU muscle was identified and protected.  There was good decompression of the nerve distally.  It was then decompressed proximally again with both the muscular fascia and investing fascia surrounding nerve.  The elbow was flexed.  The nerve did not subluxate out of the groove.  The wound was copiously irrigated with sterile saline.  The anterior flap of Osborne's ligament was repaired to the posterior skin flap subcutaneous tissues to provide a bolster.  There was no compression over the nerve.  Inverted interrupted 4-0 Vicryl sutures were placed in subcutaneous tissues and skin was closed with 4-0 nylon in a horizontal mattress fashion.  Incision was then made over the mass in the volar forearm.  This is carried into subcutaneous tissues by spreading technique.  Bipolar electrocautery was used to obtain hemostasis.  The mass was identified.  It was within the fascia.  The lateral antebrachial cutaneous nerve was adherent to its undersurface.  This nerve was carefully freed up and protected.  The mass was removed.  It was approximately 1 cm in diameter.  It was sent to pathology for examination.  The area was palpated no mass was remained.  The wound was copiously irrigated with sterile saline.  Inverted interrupted Vicryl sutures were placed in subcutaneous tissues and skin was closed with 4-0 nylon in a horizontal mattress fashion.  The wounds were all dressed with sterile Xeroform 4 x 4's and an ABD at the wrist.  These were wrapped with Kerlix and Ace bandage.  The tourniquet was deflated at 51 minutes.  Fingertips were pink with brisk capillary refill after deflation of tourniquet.  The operative  drapes were broken down.  The patient was awoken from anesthesia safely.  He was transferred back to the stretcher and taken to PACU in stable condition.  I will see him back in the office in 1 week for postoperative  followup.  I will give him a prescription for Norco 5/325 1-2 tabs PO q6 hours prn pain, dispense # 20.   Leanora Cover, MD Electronically signed, 08/25/19

## 2019-08-25 NOTE — Discharge Instructions (Addendum)
[facility] Center Instructions Hand Surgery  Wound Care: Keep your hand elevated above the level of your heart.  Do not allow it to dangle by your side.  Keep the dressing dry and do not remove it unless your doctor advises you to do so.  He will usually change it at the time of your post-op visit.  Moving your fingers is advised to stimulate circulation but will depend on the site of your surgery.  If you have a splint applied, your doctor will advise you regarding movement.  Activity: Do not drive or operate machinery today.  Rest today and then you may return to your normal activity and work as indicated by your physician.  Diet:  Drink liquids today or eat a light diet.  You may resume a regular diet tomorrow.    General expectations: Pain for two to three days. Fingers may become slightly swollen.  Call your doctor if any of the following occur: Severe pain not relieved by pain medication. Elevated temperature. Dressing soaked with blood. Inability to move fingers. White or bluish color to fingers.  No Tylenol until 4:00pm if needed.   Post Anesthesia Home Care Instructions  Activity: Get plenty of rest for the remainder of the day. A responsible individual must stay with you for 24 hours following the procedure.  For the next 24 hours, DO NOT: -Drive a car -Paediatric nurse -Drink alcoholic beverages -Take any medication unless instructed by your physician -Make any legal decisions or sign important papers.  Meals: Start with liquid foods such as gelatin or soup. Progress to regular foods as tolerated. Avoid greasy, spicy, heavy foods. If nausea and/or vomiting occur, drink only clear liquids until the nausea and/or vomiting subsides. Call your physician if vomiting continues.  Special Instructions/Symptoms: Your throat may feel dry or sore from the anesthesia or the breathing tube placed in your throat during surgery. If this causes discomfort, gargle with warm salt water.  The discomfort should disappear within 24 hours.  If you had a scopolamine patch placed behind your ear for the management of post- operative nausea and/or vomiting:  1. The medication in the patch is effective for 72 hours, after which it should be removed.  Wrap patch in a tissue and discard in the trash. Wash hands thoroughly with soap and water. 2. You may remove the patch earlier than 72 hours if you experience unpleasant side effects which may include dry mouth, dizziness or visual disturbances. 3. Avoid touching the patch. Wash your hands with soap and water after contact with the patch.    Regional Anesthesia Blocks  1. Numbness or the inability to move the "blocked" extremity may last from 3-48 hours after placement. The length of time depends on the medication injected and your individual response to the medication. If the numbness is not going away after 48 hours, call your surgeon.  2. The extremity that is blocked will need to be protected until the numbness is gone and the  Strength has returned. Because you cannot feel it, you will need to take extra care to avoid injury. Because it may be weak, you may have difficulty moving it or using it. You may not know what position it is in without looking at it while the block is in effect.  3. For blocks in the legs and feet, returning to weight bearing and walking needs to be done carefully. You will need to wait until the numbness is entirely gone and the strength has returned. You  should be able to move your leg and foot normally before you try and bear weight or walk. You will need someone to be with you when you first try to ensure you do not fall and possibly risk injury.  4. Bruising and tenderness at the needle site are common side effects and will resolve in a few days.  5. Persistent numbness or new problems with movement should be communicated to the surgeon or the [facility] [phone] [place] [phone]).

## 2019-08-25 NOTE — Anesthesia Preprocedure Evaluation (Addendum)
Anesthesia Evaluation  Patient identified by MRN, date of birth, ID band Patient awake    Reviewed: Allergy & Precautions, NPO status , Patient's Chart, lab work & pertinent test results  Airway Mallampati: I  TM Distance: >3 FB Neck ROM: Full    Dental no notable dental hx. (+) Teeth Intact, Dental Advisory Given   Pulmonary neg pulmonary ROS, Current Smoker and Patient abstained from smoking.,    Pulmonary exam normal breath sounds clear to auscultation       Cardiovascular negative cardio ROS Normal cardiovascular exam Rhythm:Regular Rate:Normal     Neuro/Psych negative neurological ROS  negative psych ROS   GI/Hepatic negative GI ROS, Neg liver ROS,   Endo/Other  negative endocrine ROS  Renal/GU negative Renal ROS  negative genitourinary   Musculoskeletal negative musculoskeletal ROS (+)   Abdominal   Peds  Hematology negative hematology ROS (+)   Anesthesia Other Findings   Reproductive/Obstetrics                            Anesthesia Physical Anesthesia Plan  ASA: II  Anesthesia Plan: MAC and Regional   Post-op Pain Management:  Regional for Post-op pain   Induction: Intravenous  PONV Risk Score and Plan: 1 and Propofol infusion, Treatment may vary due to age or medical condition, Midazolam and Ondansetron  Airway Management Planned: Natural Airway  Additional Equipment:   Intra-op Plan:   Post-operative Plan:   Informed Consent: I have reviewed the patients History and Physical, chart, labs and discussed the procedure including the risks, benefits and alternatives for the proposed anesthesia with the patient or authorized representative who has indicated his/her understanding and acceptance.     Dental advisory given  Plan Discussed with: CRNA  Anesthesia Plan Comments:         Anesthesia Quick Evaluation

## 2019-08-25 NOTE — Op Note (Signed)
I assisted Surgeon(s) and Role:    * Leanora Cover, MD - Primary    * Daryll Brod, MD on the Procedure(s): EXCISION MASS RIGHT FOREARM X2 CARPAL TUNNEL RELEASE ULNAR NERVE DECOMPRESSION AT [facility] on 08/25/2019.  I provided assistance on this case as follows: Set up, approach release of the median nerve at the wrist set up, approach identification decompression of the ulnar nerve at the elbow with retraction proximally and distally  with the retractors for extensive decompression, creation of retaining fascial flap closure of the wound, excision of the mass with closure of the wound and application of the dressings.   Electronically signed by: Daryll Brod, MD Date: 08/25/2019 Time: 11:44 AM

## 2019-08-25 NOTE — Progress Notes (Signed)
Assisted Dr. Woodrum with right, ultrasound guided, supraclavicular block. Side rails up, monitors on throughout procedure. See vital signs in flow sheet. Tolerated Procedure well. 

## 2019-08-25 NOTE — Transfer of Care (Signed)
Immediate Anesthesia Transfer of Care Note  Patient: ELIBERTO SOLE  Procedure(s) Performed: EXCISION MASS RIGHT FOREARM X2 (Right Arm Lower) CARPAL TUNNEL RELEASE (Right Wrist) ULNAR NERVE DECOMPRESSION AT THE ELBOW POSSIBLETRANSPOSITION (Right Elbow)  Patient Location: PACU  Anesthesia Type:MAC combined with regional for post-op pain  Level of Consciousness: sedated  Airway & Oxygen Therapy: Patient Spontanous Breathing and Patient connected to nasal cannula oxygen  Post-op Assessment: Report given to RN and Post -op Vital signs reviewed and stable  Post vital signs: Reviewed and stable  Last Vitals:  Vitals Value Taken Time  BP    Temp    Pulse    Resp    SpO2      Last Pain:  Vitals:   08/25/19 0951  TempSrc: Oral  PainSc: 6       Patients Stated Pain Goal: 5 (97/94/80 1655)  Complications: No complications documented.

## 2019-08-25 NOTE — Anesthesia Postprocedure Evaluation (Signed)
Anesthesia Post Note  Patient: Travis Riley  Procedure(s) Performed: EXCISION MASS RIGHT FOREARM X2 (Right Arm Lower) CARPAL TUNNEL RELEASE (Right Wrist) ULNAR NERVE DECOMPRESSION AT THE ELBOW POSSIBLETRANSPOSITION (Right Elbow)     Patient location during evaluation: PACU Anesthesia Type: Regional and MAC Level of consciousness: awake and alert Pain management: pain level controlled Vital Signs Assessment: post-procedure vital signs reviewed and stable Respiratory status: spontaneous breathing, nonlabored ventilation, respiratory function stable and patient connected to nasal cannula oxygen Cardiovascular status: stable and blood pressure returned to baseline Postop Assessment: no apparent nausea or vomiting Anesthetic complications: no   No complications documented.  Last Vitals:  Vitals:   08/25/19 1240 08/25/19 1242  BP:  109/78  Pulse: 65 (!) 48  Resp: 16 18  Temp:  36.6 C  SpO2: 100% 100%    Last Pain:  Vitals:   08/25/19 1242  TempSrc: Oral  PainSc: 0-No pain                 [name]

## 2019-08-26 ENCOUNTER — Encounter (HOSPITAL_BASED_OUTPATIENT_CLINIC_OR_DEPARTMENT_OTHER): Payer: Self-pay | Admitting: Orthopedic Surgery

## 2019-08-30 LAB — SURGICAL PATHOLOGY

## 2019-09-01 ENCOUNTER — Encounter (HOSPITAL_BASED_OUTPATIENT_CLINIC_OR_DEPARTMENT_OTHER): Payer: Self-pay | Admitting: Orthopedic Surgery

## 2020-04-18 ENCOUNTER — Encounter (HOSPITAL_COMMUNITY): Payer: Self-pay | Admitting: Emergency Medicine

## 2020-04-18 ENCOUNTER — Emergency Department (HOSPITAL_COMMUNITY)
Admission: EM | Admit: 2020-04-18 | Discharge: 2020-04-19 | Disposition: A | Discharge status: Home / Self Care | Payer: BC Managed Care – PPO | Attending: Emergency Medicine | Admitting: Emergency Medicine

## 2020-04-18 ENCOUNTER — Other Ambulatory Visit: Payer: Self-pay

## 2020-04-18 DIAGNOSIS — R1030 Lower abdominal pain, unspecified: Secondary | ICD-10-CM | POA: Diagnosis present

## 2020-04-18 DIAGNOSIS — A084 Viral intestinal infection, unspecified: Secondary | ICD-10-CM

## 2020-04-18 DIAGNOSIS — R824 Acetonuria: Secondary | ICD-10-CM | POA: Diagnosis not present

## 2020-04-18 DIAGNOSIS — E871 Hypo-osmolality and hyponatremia: Secondary | ICD-10-CM

## 2020-04-18 LAB — CBC WITH DIFFERENTIAL/PLATELET
Abs Immature Granulocytes: 0.01 10*3/uL (ref 0.00–0.07)
Basophils Absolute: 0 10*3/uL (ref 0.0–0.1)
Basophils Relative: 1 %
Eosinophils Absolute: 0.1 10*3/uL (ref 0.0–0.5)
Eosinophils Relative: 2 %
HCT: 44.5 % (ref 39.0–52.0)
Hemoglobin: 15.2 g/dL (ref 13.0–17.0)
Immature Granulocytes: 0 %
Lymphocytes Relative: 20 %
Lymphs Abs: 1 10*3/uL (ref 0.7–4.0)
MCH: 31.7 pg (ref 26.0–34.0)
MCHC: 34.2 g/dL (ref 30.0–36.0)
MCV: 92.7 fL (ref 80.0–100.0)
Monocytes Absolute: 0.4 10*3/uL (ref 0.1–1.0)
Monocytes Relative: 9 %
Neutro Abs: 3.2 10*3/uL (ref 1.7–7.7)
Neutrophils Relative %: 68 %
Platelets: 206 10*3/uL (ref 150–400)
RBC: 4.8 MIL/uL (ref 4.22–5.81)
RDW: 11.4 % — ABNORMAL LOW (ref 11.5–15.5)
WBC: 4.7 10*3/uL (ref 4.0–10.5)
nRBC: 0 % (ref 0.0–0.2)

## 2020-04-18 LAB — COMPREHENSIVE METABOLIC PANEL
ALT: 18 U/L (ref 0–44)
AST: 19 U/L (ref 15–41)
Albumin: 4.1 g/dL (ref 3.5–5.0)
Alkaline Phosphatase: 47 U/L (ref 38–126)
Anion gap: 8 (ref 5–15)
BUN: 13 mg/dL (ref 6–20)
CO2: 25 mmol/L (ref 22–32)
Calcium: 9.2 mg/dL (ref 8.9–10.3)
Chloride: 101 mmol/L (ref 98–111)
Creatinine, Ser: 0.86 mg/dL (ref 0.61–1.24)
GFR, Estimated: >60 mL/min (ref >60)
Glucose, Bld: 107 mg/dL — ABNORMAL HIGH (ref 70–99)
Potassium: 3.5 mmol/L (ref 3.5–5.1)
Sodium: 134 mmol/L — ABNORMAL LOW (ref 135–145)
Total Bilirubin: 0.7 mg/dL (ref 0.3–1.2)
Total Protein: 7.1 g/dL (ref 6.5–8.1)

## 2020-04-18 LAB — LIPASE, BLOOD: Lipase: 22 U/L (ref 11–51)

## 2020-04-18 NOTE — ED Triage Notes (Signed)
Pt to the ED with abdominal pain and emesis with blood since last night.  Pt took an at home test for Covid and it was negative.

## 2020-04-18 NOTE — ED Provider Notes (Signed)
MSE was initiated and I personally evaluated the patient and placed orders (if any) at  10:24 PM on April 18, 2020.  Patient presented to ED with complaints of abdominal pain body aches.  Patient states he also started to have blood-tinged emesis.  This concerned him so he came to the ED.  On exam patient has mild abdominal discomfort.  No rebound or guarding.  Patient still needs to have vital signs.    Dorie Rank, MD 04/18/20 2225

## 2020-04-19 ENCOUNTER — Encounter (HOSPITAL_COMMUNITY): Payer: Self-pay | Admitting: Radiology

## 2020-04-19 ENCOUNTER — Emergency Department (HOSPITAL_COMMUNITY): Payer: BC Managed Care – PPO

## 2020-04-19 LAB — URINALYSIS, ROUTINE W REFLEX MICROSCOPIC
Bilirubin Urine: NEGATIVE
Glucose, UA: NEGATIVE mg/dL
Hgb urine dipstick: NEGATIVE
Ketones, ur: 20 mg/dL — AB
Leukocytes,Ua: NEGATIVE
Nitrite: NEGATIVE
Protein, ur: NEGATIVE mg/dL
Specific Gravity, Urine: 1.023 (ref 1.005–1.030)
pH: 6 (ref 5.0–8.0)

## 2020-04-19 MED ORDER — IOHEXOL 300 MG/ML  SOLN
100.0000 mL | Freq: Once | INTRAMUSCULAR | Status: AC | PRN
  Administered 2020-04-19: 100 mL via INTRAVENOUS

## 2020-04-19 MED ORDER — ONDANSETRON HCL 4 MG PO TABS: 4.0000 mg | ORAL_TABLET | Freq: Four times a day (QID) | ORAL | 0 refills | Status: AC | PRN

## 2020-04-19 MED ORDER — ONDANSETRON HCL 4 MG/2ML IJ SOLN
4.0000 mg | Freq: Once | INTRAMUSCULAR | Status: AC
  Administered 2020-04-19: 4 mg via INTRAVENOUS
  Filled 2020-04-19: qty 2

## 2020-04-19 MED ORDER — MORPHINE SULFATE (PF) 4 MG/ML IV SOLN
4.0000 mg | Freq: Once | INTRAVENOUS | Status: AC
  Administered 2020-04-19: 4 mg via INTRAVENOUS
  Filled 2020-04-19: qty 1

## 2020-04-19 MED ORDER — LOPERAMIDE HCL 2 MG PO CAPS
4.0000 mg | ORAL_CAPSULE | Freq: Once | ORAL | Status: AC
  Administered 2020-04-19: 4 mg via ORAL
  Filled 2020-04-19: qty 2

## 2020-04-19 MED ORDER — LACTATED RINGERS IV BOLUS
1000.0000 mL | Freq: Once | INTRAVENOUS | Status: AC
  Administered 2020-04-19: 1000 mL via INTRAVENOUS

## 2020-04-19 NOTE — ED Provider Notes (Signed)
[facility] EMERGENCY DEPARTMENT Provider Note   CSN: [id number] Arrival date & time: 04/18/20  2043   History Chief Complaint  Patient presents with  . Abdominal Pain    Travis Riley is a 30 y.o. male.  The history is provided by the patient.  Abdominal Pain He had onset 2 days ago pain across the lower abdomen with some radiation to the back.  Pain generally comes in waves.  Nothing seems to make it better, nothing makes it worse.  Yesterday, he started having episodes of emesis.  Tonight, he had some blood mixed with his emesis.  He denies fever but has had chills and sweats.  He denies any urinary difficulty.  Pain is currently rated at 8/10, but it has been as severe as 10/10.  He has never had similar symptoms before.  History reviewed. No pertinent past medical history.  There are no problems to display for this patient.   Past Surgical History:  Procedure Laterality Date  . CARPAL TUNNEL RELEASE Right 08/25/2019   Procedure: CARPAL TUNNEL RELEASE;  Surgeon: Leanora Cover, MD;  Location: [facility];  Service: Orthopedics;  Laterality: Right;  . EXTERNAL EAR SURGERY    . MASS EXCISION Right 08/25/2019   Procedure: EXCISION MASS RIGHT FOREARM X1;  Surgeon: Leanora Cover, MD;  Location: [place];  Service: Orthopedics;  Laterality: Right;  . ULNAR NERVE TRANSPOSITION Right 08/25/2019   Procedure: ULNAR NERVE DECOMPRESSION AT THE ELBOW;  Surgeon: Leanora Cover, MD;  Location: [place];  Service: Orthopedics;  Laterality: Right;       History reviewed. No pertinent family history.  Social History   Tobacco Use  . Smoking status: Never Smoker  . Smokeless tobacco: Former Network engineer  . Vaping Use: Never used  Substance Use Topics  . Alcohol use: No  . Drug use: Yes    Types: Marijuana    Comment: daily    Home Medications Prior to Admission medications   Medication Sig Start Date End Date Taking? Authorizing  Provider  HYDROcodone-acetaminophen [facility]) 5-325 MG tablet 1-2 tabs po q6 hours prn pain 08/25/19   Leanora Cover, MD    Allergies    Patient has no known allergies.  Review of Systems   Review of Systems  Gastrointestinal: Positive for abdominal pain.  All other systems reviewed and are negative.   Physical Exam Updated Vital Signs BP 127/85 (BP Location: Right Arm)   Pulse 62   Temp 98.9 F (37.2 C) (Oral)   Resp 16   Ht 6' (1.829 m)   Wt 77.1 kg   SpO2 98%   BMI 23.06 kg/m   Physical Exam Vitals and nursing note reviewed.   30 year old male, resting comfortably and in no acute distress. Vital signs are normal. Oxygen saturation is 98%, which is normal. Head is normocephalic and atraumatic. PERRLA, EOMI. Oropharynx is clear. Neck is nontender and supple without adenopathy or JVD. Back is nontender and there is no CVA tenderness. Lungs are clear without rales, wheezes, or rhonchi. Chest is nontender. Heart has regular rate and rhythm without murmur. Abdomen is soft, flat, with moderate tenderness in the suprapubic area into the right lower quadrant.  There is no rebound or guarding.  There are no masses or hepatosplenomegaly and peristalsis is hypoactive. Extremities have no cyanosis or edema, full range of motion is present. Skin is warm and dry without rash. Neurologic: Mental status is normal, cranial nerves are  intact, there are no motor or sensory deficits.  ED Results / Procedures / Treatments   Labs (all labs ordered are listed, but only abnormal results are displayed) Labs Reviewed  COMPREHENSIVE METABOLIC PANEL - Abnormal; Notable for the following components:      Result Value   Sodium 134 (*)    Glucose, Bld 107 (*)    All other components within normal limits  CBC WITH DIFFERENTIAL/PLATELET - Abnormal; Notable for the following components:   RDW 11.4 (*)    All other components within normal limits  URINALYSIS, ROUTINE W REFLEX MICROSCOPIC - Abnormal;  Notable for the following components:   Ketones, ur 20 (*)    All other components within normal limits  LIPASE, BLOOD   Radiology CT ABDOMEN PELVIS W CONTRAST  Result Date: 04/19/2020 CLINICAL DATA:  Right lower quadrant pain EXAM: CT ABDOMEN AND PELVIS WITH CONTRAST TECHNIQUE: Multidetector CT imaging of the abdomen and pelvis was performed using the standard protocol following bolus administration of intravenous contrast. CONTRAST:  147mL OMNIPAQUE IOHEXOL 300 MG/ML  SOLN COMPARISON:  None. FINDINGS: Lower chest: No acute abnormality. Hepatobiliary: Liver is within normal limits. The gallbladder is not well appreciated and likely decompressed. Pancreas: Unremarkable. No pancreatic ductal dilatation or surrounding inflammatory changes. Spleen: Normal in size without focal abnormality. Adrenals/Urinary Tract: Adrenal glands are within normal limits. Kidneys demonstrate a normal enhancement pattern bilaterally. No renal calculi or obstructive changes are seen. The ureters are unremarkable. Bladder is decompressed. Stomach/Bowel: The appendix is not well visualized. No inflammatory changes to suggest appendicitis are noted. No obstructive or inflammatory changes of the colon are seen. Fluid is noted throughout the small bowel without obstructive change. The stomach is within normal limits. Vascular/Lymphatic: No significant vascular findings are present. No enlarged abdominal or pelvic lymph nodes. Reproductive: Prostate is unremarkable. Other: No abdominal wall hernia or abnormality. No abdominopelvic ascites. Musculoskeletal: No acute or significant osseous findings. IMPRESSION: Fluid throughout the small bowel. This may represent a mild diarrheal state. No other focal abnormality is seen. The appendix is not well seen although no inflammatory changes are noted. Gallbladder is also not well appreciated likely related to decompression. Clinical correlation is recommended. Electronically Signed   By: Inez Catalina M.D.   On: 04/19/2020 03:08    Procedures Procedures   Medications Ordered in ED Medications  lactated ringers bolus 1,000 mL (has no administration in time range)  morphine 4 MG/ML injection 4 mg (has no administration in time range)  ondansetron (ZOFRAN) injection 4 mg (has no administration in time range)    ED Course  I have reviewed the triage vital signs and the nursing notes.  Pertinent labs & imaging results that were available during my care of the patient were reviewed by me and considered in my medical decision making (see chart for details).  MDM Rules/Calculators/A&P Lower abdominal pain concerning for appendicitis.  Consider diverticulitis, urinary tract infection, urolithiasis.  Differential includes conditions with significant morbidity and mortality.  Labs show mild hyponatremia, mild ketonuria.  He will be given IV fluids, ondansetron, and sent for CT of abdomen and pelvis to rule out appendicitis.  Old records are reviewed, and he has no relevant past visits.  CT shows no evidence of appendicitis, liquid stool present suggestive of diarrhea.  Patient actually tells me that he has had diarrhea for the last 2 days and his son had diarrhea last week.  This appears to be a viral gastroenteritis.  He has had an episode of  diarrhea in the ED.  He is given a dose of loperamide and will be discharged with instructions to use over-the-counter loperamide as needed.  Final Clinical Impression(s) / ED Diagnoses Final diagnoses:  Gastroenteritis and colitis, viral  Lower abdominal pain  Hyponatremia    Rx / DC Orders ED Discharge Orders         Ordered    ondansetron (ZOFRAN) 4 MG tablet  Every 6 hours PRN        04/19/20 6415           Delora Fuel, MD 83/09/40 [phone]

## 2020-04-19 NOTE — Discharge Instructions (Addendum)
Take loperamide (Imodium A-D) as needed for diarrhea.  Return if your symptoms are getting worse.

## 2020-04-19 NOTE — ED Notes (Signed)
Patient transported to CT 

## 2021-08-17 ENCOUNTER — Encounter (HOSPITAL_COMMUNITY): Payer: Self-pay

## 2021-08-17 ENCOUNTER — Emergency Department (HOSPITAL_COMMUNITY)
Admission: EM | Admit: 2021-08-17 | Discharge: 2021-08-17 | Disposition: A | Discharge status: Home / Self Care | Payer: BC Managed Care – PPO | Attending: Emergency Medicine | Admitting: Emergency Medicine

## 2021-08-17 ENCOUNTER — Emergency Department (HOSPITAL_COMMUNITY): Payer: BC Managed Care – PPO

## 2021-08-17 ENCOUNTER — Other Ambulatory Visit: Payer: Self-pay

## 2021-08-17 DIAGNOSIS — S90512A Abrasion, left ankle, initial encounter: Secondary | ICD-10-CM | POA: Insufficient documentation

## 2021-08-17 DIAGNOSIS — S80211A Abrasion, right knee, initial encounter: Secondary | ICD-10-CM | POA: Insufficient documentation

## 2021-08-17 DIAGNOSIS — T07XXXA Unspecified multiple injuries, initial encounter: Secondary | ICD-10-CM

## 2021-08-17 DIAGNOSIS — Z01818 Encounter for other preprocedural examination: Secondary | ICD-10-CM | POA: Insufficient documentation

## 2021-08-17 DIAGNOSIS — S61401A Unspecified open wound of right hand, initial encounter: Secondary | ICD-10-CM

## 2021-08-17 DIAGNOSIS — W208XXA Other cause of strike by thrown, projected or falling object, initial encounter: Secondary | ICD-10-CM | POA: Insufficient documentation

## 2021-08-17 DIAGNOSIS — S8002XA Contusion of left knee, initial encounter: Secondary | ICD-10-CM

## 2021-08-17 MED ORDER — BACITRACIN ZINC 500 UNIT/GM EX OINT
TOPICAL_OINTMENT | Freq: Two times a day (BID) | CUTANEOUS | Status: DC
  Filled 2021-08-17: qty 5.4

## 2021-08-17 MED ORDER — ACETAMINOPHEN 325 MG PO TABS
650.0000 mg | ORAL_TABLET | Freq: Once | ORAL | Status: AC
  Administered 2021-08-17: 650 mg via ORAL
  Filled 2021-08-17: qty 2

## 2021-08-17 MED ORDER — ACETAMINOPHEN 325 MG PO TABS
ORAL_TABLET | ORAL | Status: AC
  Filled 2021-08-17: qty 1
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
## 2021-08-17 NOTE — Discharge Instructions (Signed)
The x-ray of the right hand shows an old healed broken bone, but no new broken bones.  The x-ray of your knee is negative.  Please use over-the-counter medications for pain and discomfort, apply ice as needed.  Follow-up with your doctor in 1 week if not improving.

## 2021-12-19 IMAGING — US US EXTREM UP *R* LTD
2 series · 13 of 25 positions shown · non-contrast
Comparison: None.

CLINICAL DATA: Two masses of the volar aspect of the right forearm.

EXAM:
ULTRASOUND RIGHT UPPER EXTREMITY LIMITED
TECHNIQUE: Ultrasound examination of the upper extremity soft tissues was
performed in the area of clinical concern.

[Series 1: us extrem up *right* ltd · 0.06mm/px · 20 acquisitions, 8 frames shown (1 of 2)]
[im 1/20]
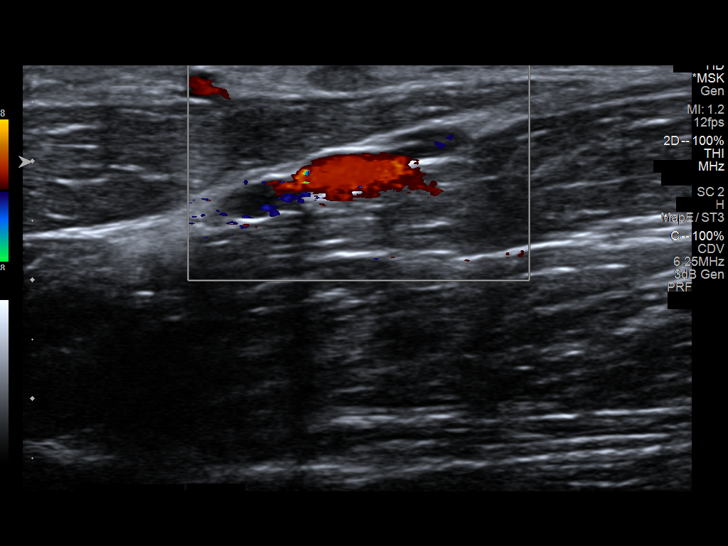
[im 3/20]
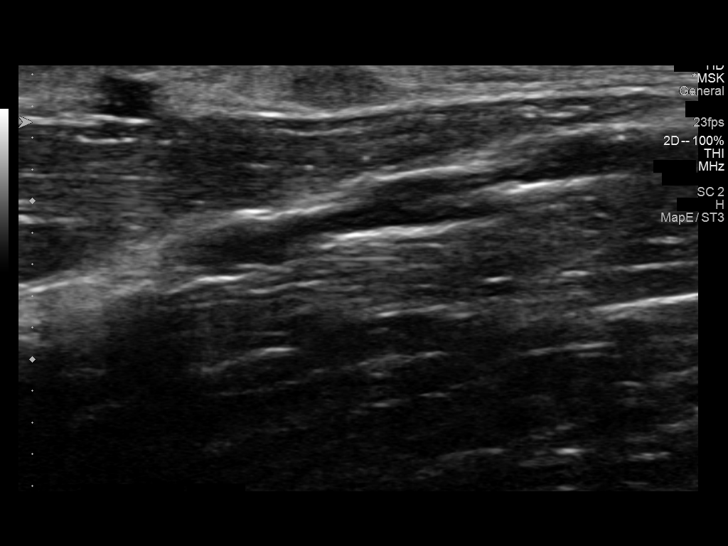
[im 6/20]
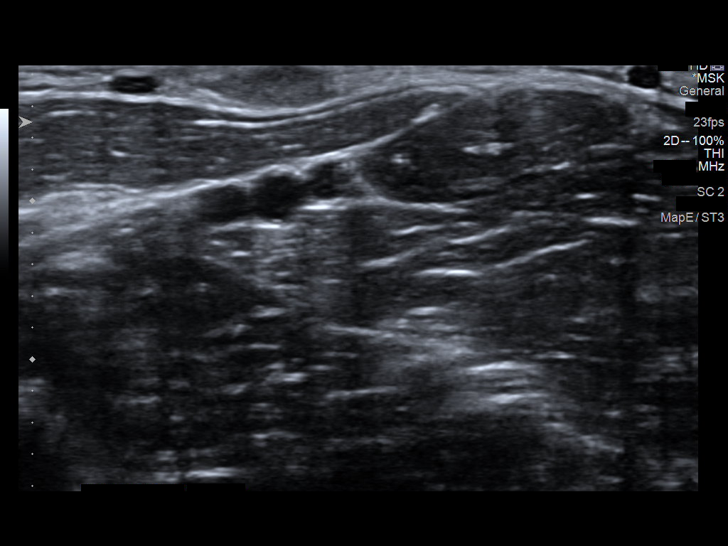
[im 8/20]
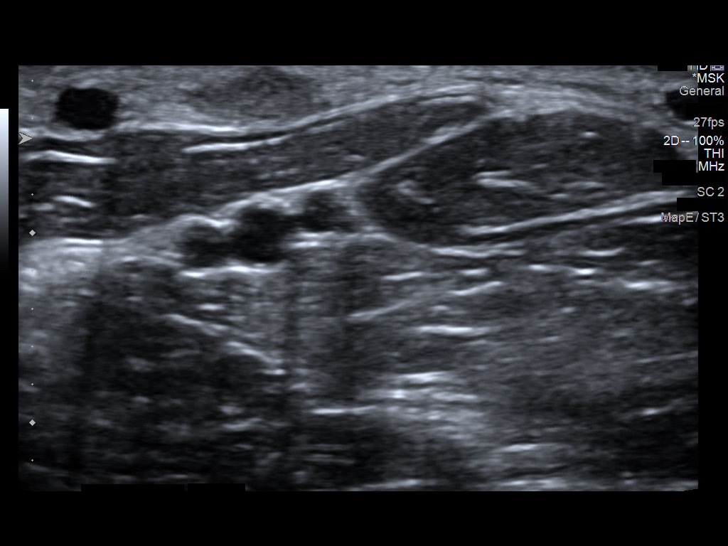
[im 11/20]
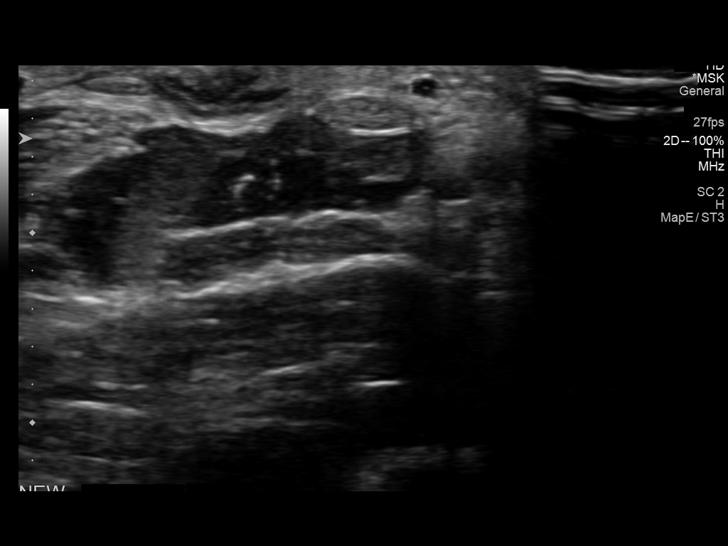
[im 13/20]
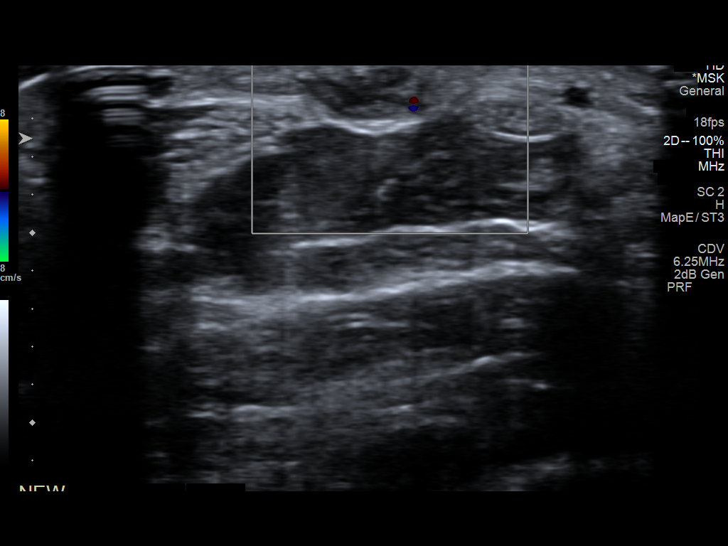
[im 16/20]
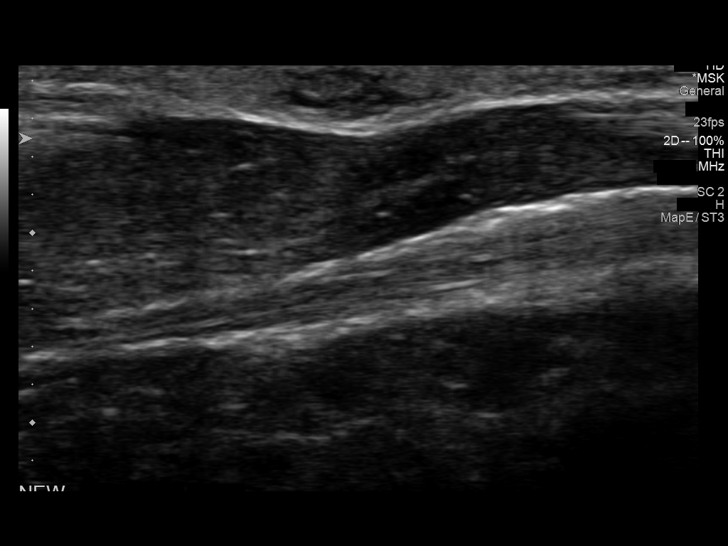
[im 18/20]
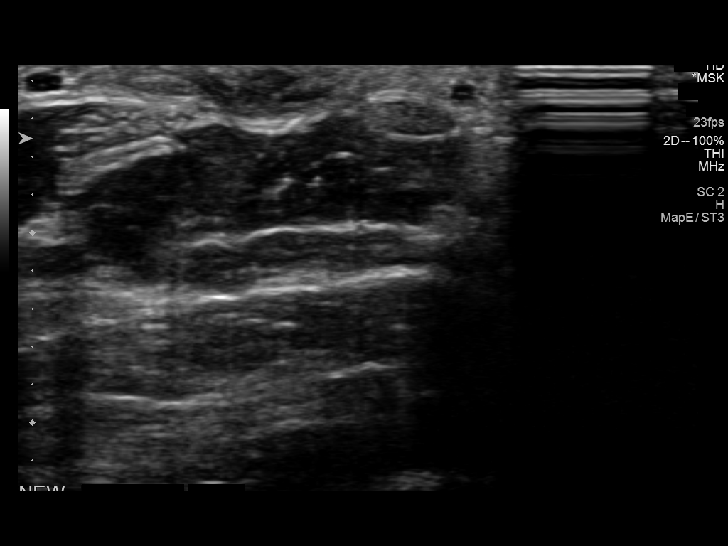

[Series 2: us extrem up *right* ltd · 0.04mm/px · 5 of 12 slices shown (2 of 2)]
[im 1/12]
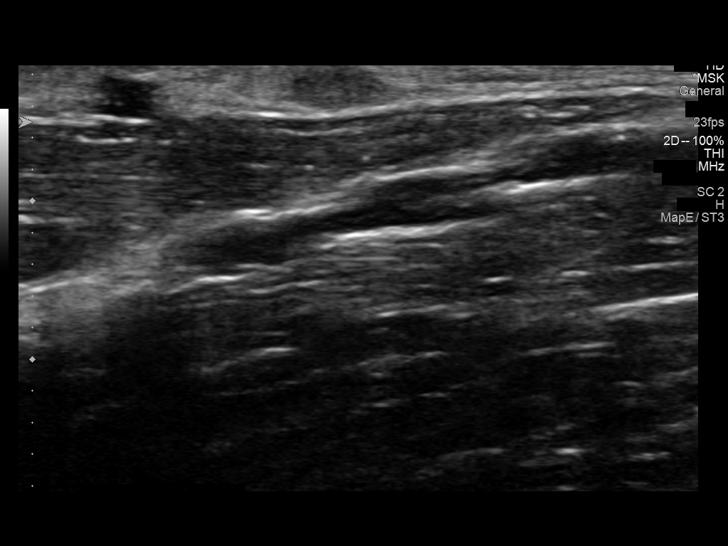
[im 3/12]
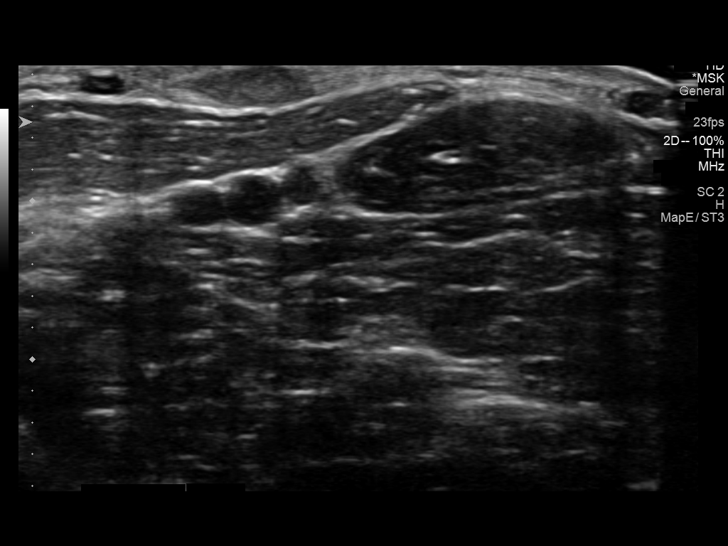
[im 6/12]
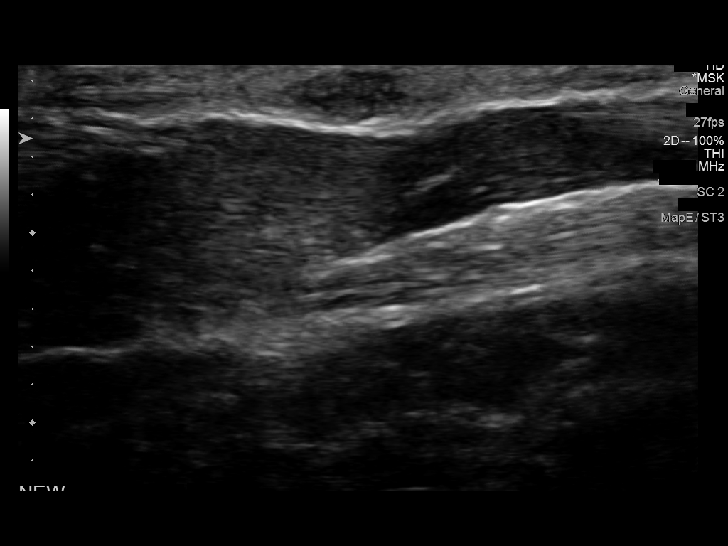
[im 9/12]
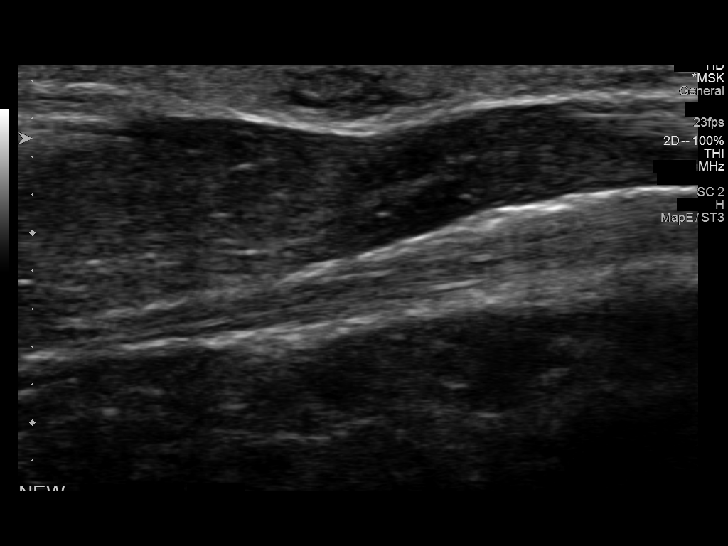
[im 12/12]
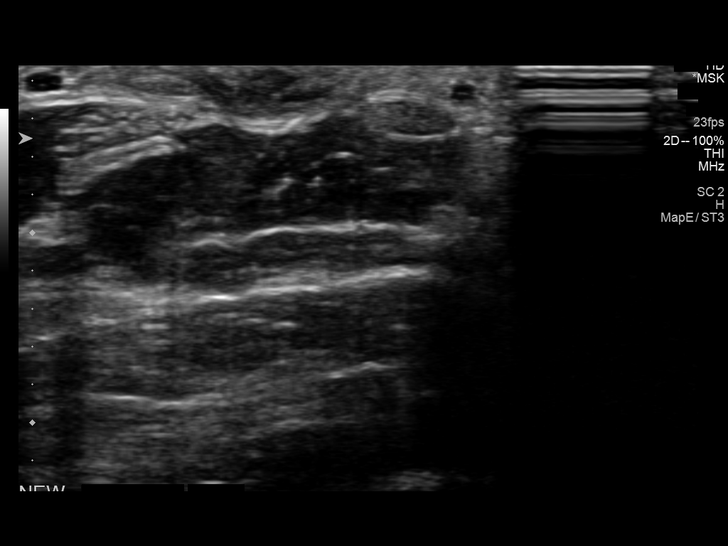

[13 of 25 positions shown; findings below may reference images not displayed]

FINDINGS: There is an 8 x 6 x 3 mm well-defined mass in the superficial
subcutaneous fat of the mid forearm. There is no internal
vascularity. No visible association with the underlying muscle. No
vascularity to the lesion.

There is a 7 x 7 x 3 mm slightly less well-defined mass in the
subcutaneous fat of the distal forearm which the patient reports is
new since his appointment with Dr. Mansaini. There are adjacent
arteries and veins but the mass itself is not vascular. The
underlying muscles and tendons are normal.
IMPRESSION: The masses in the right forearm are not typical for any specific
lesion. I do not think these represent neoplasm. I suspect the
abnormalities represent areas of scarring from previous trauma. The
patient does report the more proximal lesion had associated bruising
when it first occurred but he does not recall specific trauma to
either area.

If the areas enlarge, then atypical soft tissue neoplasms should be
considered.

## 2022-10-17 IMAGING — CT CT ABD-PELV W/ CM
2 of 4 series · 16 of 46 positions shown, 18 images · IV contrast (Omnipaque or Isovue)
Comparison: None.

CLINICAL DATA: Right lower quadrant pain

EXAM:
CT ABDOMEN AND PELVIS WITH CONTRAST
TECHNIQUE: Multidetector CT imaging of the abdomen and pelvis was performed
using the standard protocol following bolus administration of
intravenous contrast.
CONTRAST:  100mL OMNIPAQUE IOHEXOL 300 MG/ML  SOLN

[Series 2: axial st · axial · 0.75mm/px · z∈[-656,-196]mm · 13 of 101 slices shown, 15 images]
[im 5/101  soft-tissue]
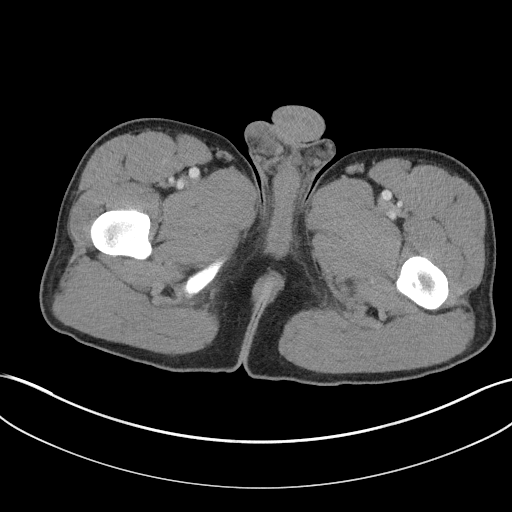
[im 5/101  bone]
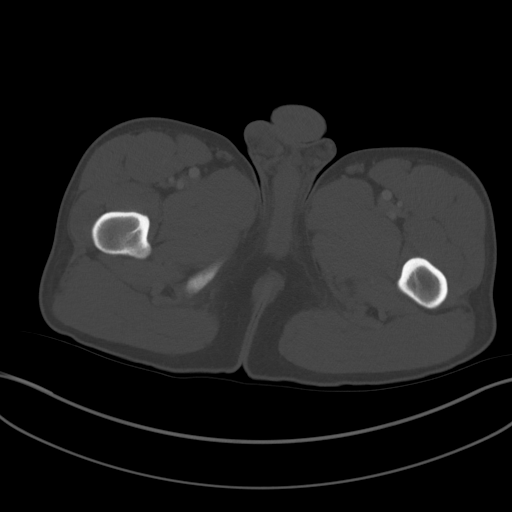
[im 13/101  soft-tissue]
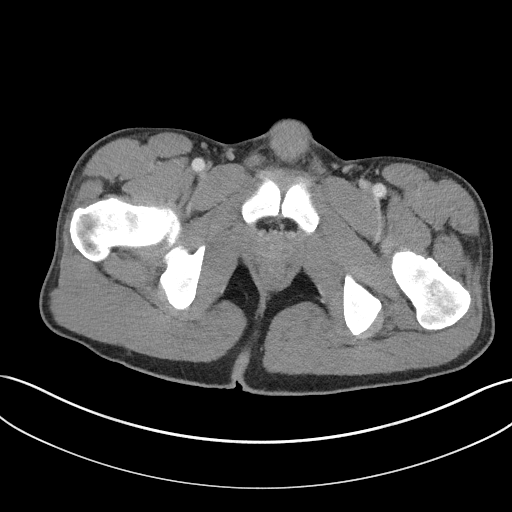
[im 21/101  soft-tissue]
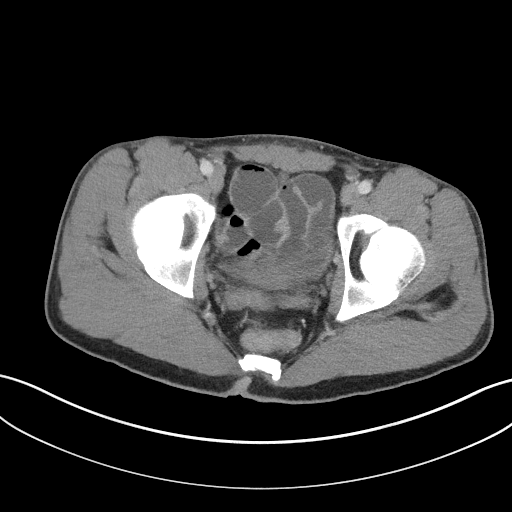
[im 29/101  soft-tissue]
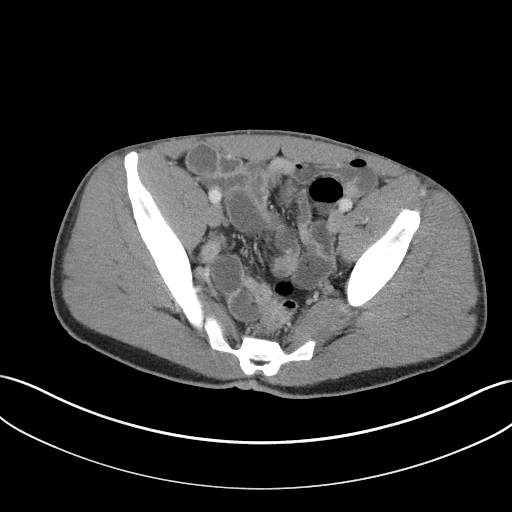
[im 37/101  soft-tissue]
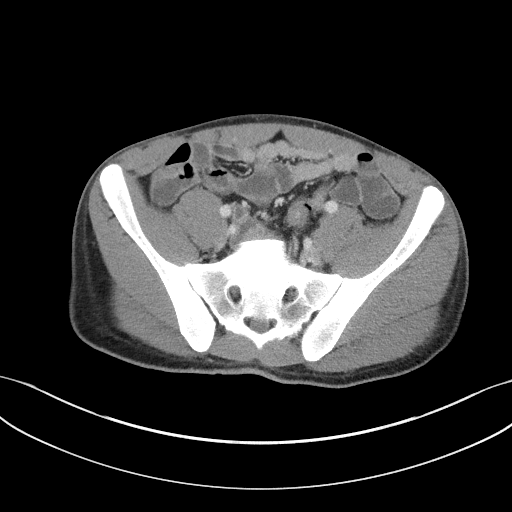
[im 45/101  soft-tissue]
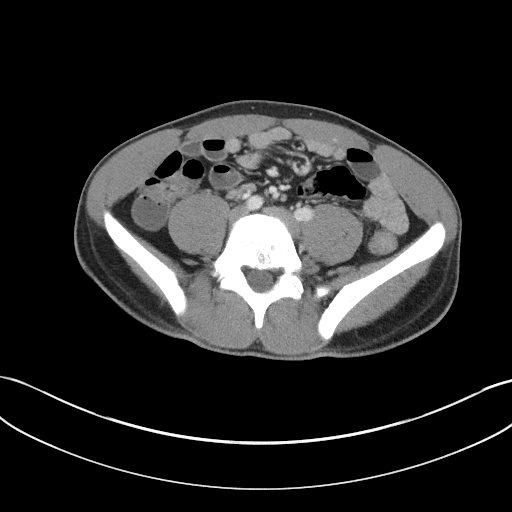
[im 53/101  soft-tissue]
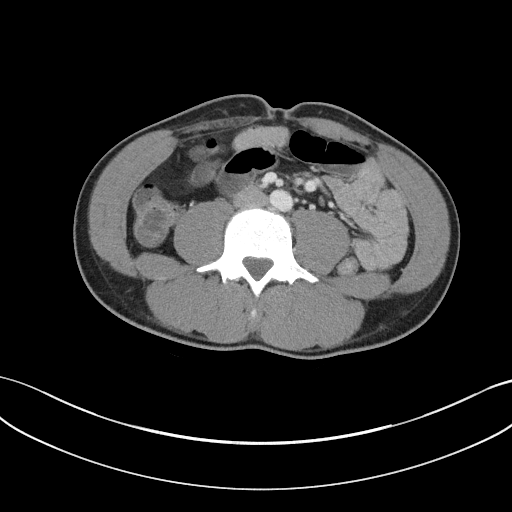
[im 57/101  soft-tissue]
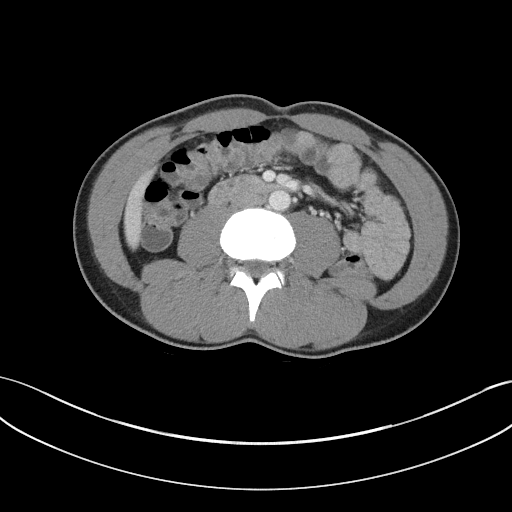
[im 65/101  soft-tissue]
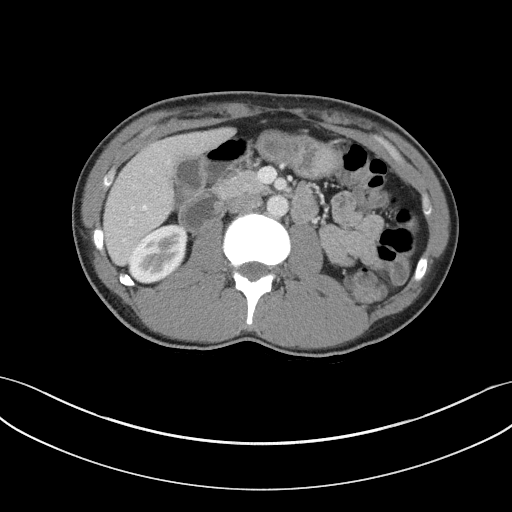
[im 65/101  bone]
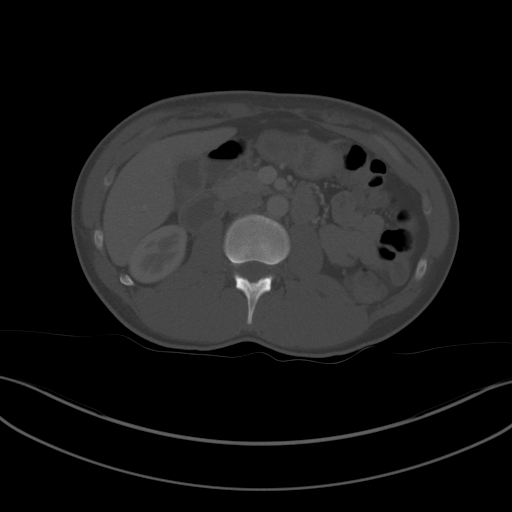
[im 73/101  soft-tissue]
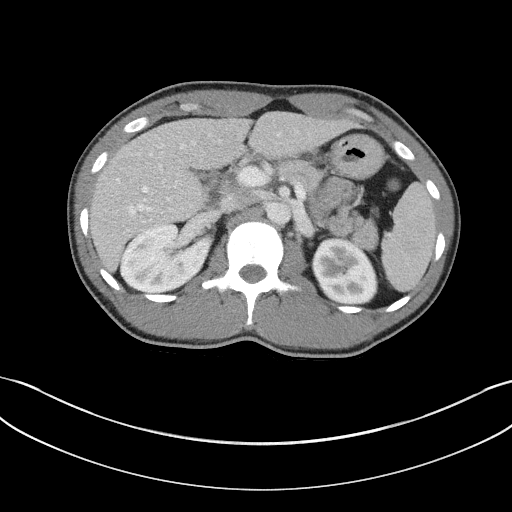
[im 81/101  soft-tissue]
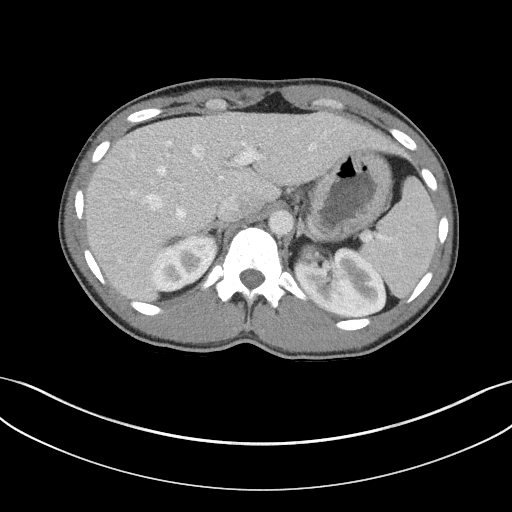
[im 89/101  soft-tissue]
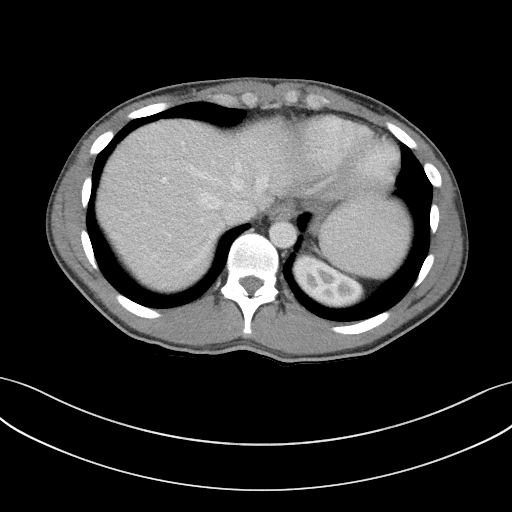
[im 97/101  soft-tissue]
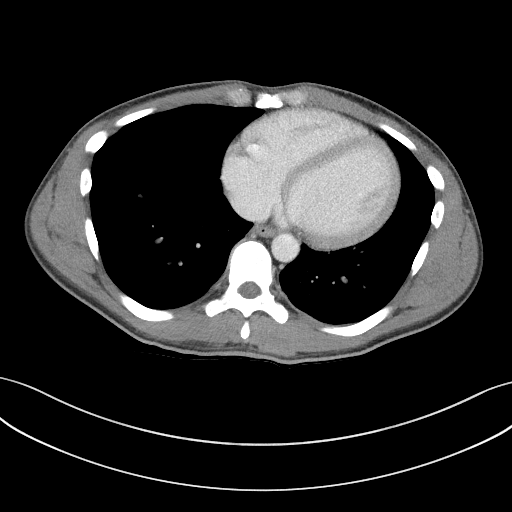

[Series 5: coronal st · coronal · 0.74mm/px · 3 of 85 slices shown]
[im 29/85  soft-tissue]
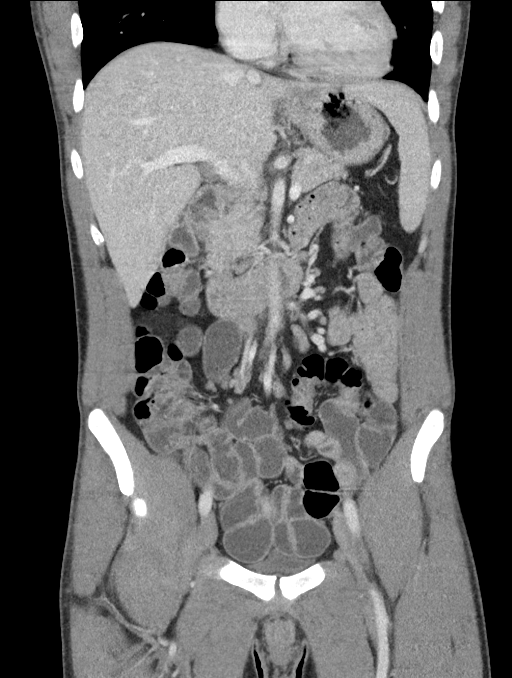
[im 38/85  soft-tissue]
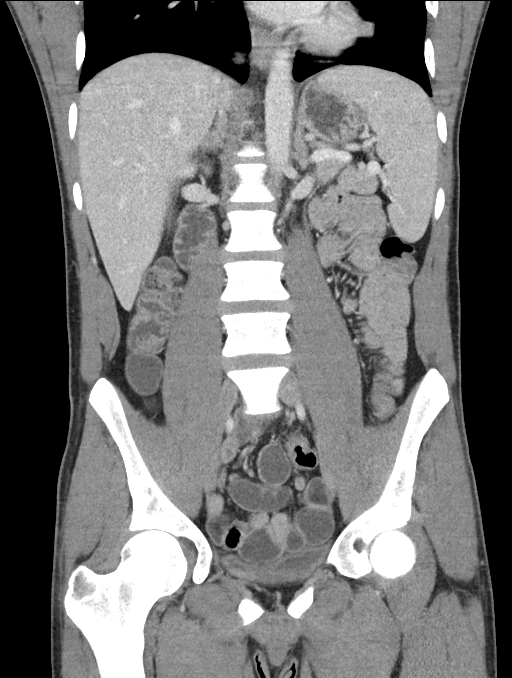
[im 47/85  soft-tissue]
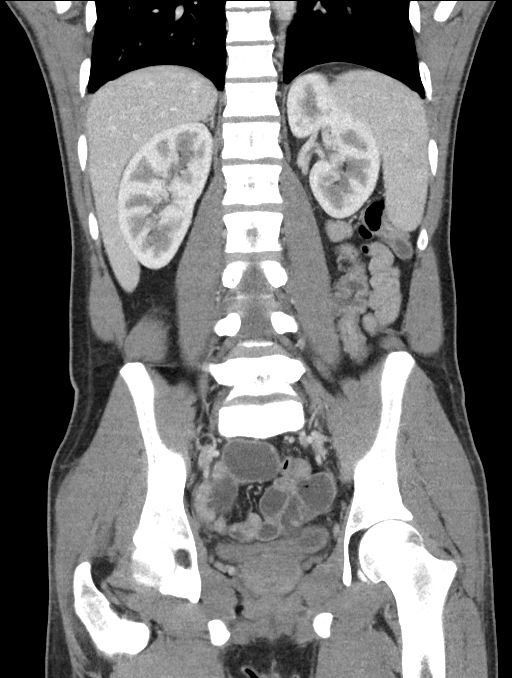

[16 of 46 positions shown; findings below may reference images not displayed]

FINDINGS: Lower chest: No acute abnormality.

Hepatobiliary: Liver is within normal limits. The gallbladder is not
well appreciated and likely decompressed.

Pancreas: Unremarkable. No pancreatic ductal dilatation or
surrounding inflammatory changes.

Spleen: Normal in size without focal abnormality.

Adrenals/Urinary Tract: Adrenal glands are within normal limits.
Kidneys demonstrate a normal enhancement pattern bilaterally. No
renal calculi or obstructive changes are seen. The ureters are
unremarkable. Bladder is decompressed.

Stomach/Bowel: The appendix is not well visualized. No inflammatory
changes to suggest appendicitis are noted. No obstructive or
inflammatory changes of the colon are seen. Fluid is noted
throughout the small bowel without obstructive change. The stomach
is within normal limits.

Vascular/Lymphatic: No significant vascular findings are present. No
enlarged abdominal or pelvic lymph nodes.

Reproductive: Prostate is unremarkable.

Other: No abdominal wall hernia or abnormality. No abdominopelvic
ascites.

Musculoskeletal: No acute or significant osseous findings.
IMPRESSION: Fluid throughout the small bowel. This may represent a mild
diarrheal state.

No other focal abnormality is seen. The appendix is not well seen
although no inflammatory changes are noted.

Gallbladder is also not well appreciated likely related to
decompression. Clinical correlation is recommended.

## 2023-01-23 ENCOUNTER — Ambulatory Visit (HOSPITAL_COMMUNITY)
Admission: EM | Admit: 2023-01-23 | Discharge: 2023-01-23 | Disposition: A | Discharge status: Home / Self Care | Payer: 59 | Attending: Emergency Medicine | Admitting: Emergency Medicine

## 2023-01-23 ENCOUNTER — Encounter (HOSPITAL_COMMUNITY): Payer: Self-pay

## 2023-01-23 ENCOUNTER — Telehealth (HOSPITAL_COMMUNITY): Payer: Self-pay | Admitting: Emergency Medicine

## 2023-01-23 DIAGNOSIS — R369 Urethral discharge, unspecified: Secondary | ICD-10-CM | POA: Diagnosis present

## 2023-01-23 DIAGNOSIS — R350 Frequency of micturition: Secondary | ICD-10-CM | POA: Insufficient documentation

## 2023-01-23 DIAGNOSIS — Z113 Encounter for screening for infections with a predominantly sexual mode of transmission: Secondary | ICD-10-CM | POA: Insufficient documentation

## 2023-01-23 NOTE — Discharge Instructions (Addendum)
 You have been screened for gonorrhea, chlamydia and trichomoniasis.  Results will be available on MyChart and staff will contact you if positive to initiate the appropriate treatment.  Abstain from intercourse until all results have been received and notify any sexual partners of any positive results.   Return to clinic for new or urgent symptoms.

## 2023-01-23 NOTE — ED Provider Notes (Signed)
 [facility]    CSN: [id number] Arrival date & time: 01/23/23  1204      History   Chief Complaint Chief Complaint  Patient presents with   SEXUALLY TRANSMITTED DISEASE    HPI Travis Riley is a 33 y.o. male.   Patient presents to clinic requesting sexually-transmitted infection screening.  He was sexually active on new years without a condom with a new partner.  Yesterday he was having some urinary frequency.  Thinks he may have some white penile discharge.  Denies any penile sores or lesions.  No dysuria.  No penile sores or lesions.  No scrotal pain or swelling.  The history is provided by the patient and medical records.    History reviewed. No pertinent past medical history.  There are no active problems to display for this patient.   Past Surgical History:  Procedure Laterality Date   CARPAL TUNNEL RELEASE Right 08/25/2019   Procedure: CARPAL TUNNEL RELEASE;  Surgeon: Murrell Drivers, MD;  Location: [facility];  Service: Orthopedics;  Laterality: Right;   EXTERNAL EAR SURGERY     MASS EXCISION Right 08/25/2019   Procedure: EXCISION MASS RIGHT FOREARM X1;  Surgeon: Murrell Drivers, MD;  Location: [facility];  Service: Orthopedics;  Laterality: Right;   ULNAR NERVE TRANSPOSITION Right 08/25/2019   Procedure: ULNAR NERVE DECOMPRESSION AT THE ELBOW;  Surgeon: Murrell Drivers, MD;  Location: [facility];  Service: Orthopedics;  Laterality: Right;       Home Medications    Prior to Admission medications   Medication Sig Start Date End Date Taking? Authorizing Provider  ondansetron  (ZOFRAN ) 4 MG tablet Take 1 tablet (4 mg total) by mouth every 6 (six) hours as needed for nausea or vomiting. 04/19/20   Raford Lenis, MD    Family History History reviewed. No pertinent family history.  Social History Social History   Tobacco Use   Smoking status: Never   Smokeless tobacco: Former  Building Services Engineer status: Never Used   Substance Use Topics   Alcohol use: No   Drug use: Yes    Types: Marijuana    Comment: daily     Allergies   Patient has no known allergies.   Review of Systems Review of Systems   Physical Exam Triage Vital Signs ED Triage Vitals  Encounter Vitals Group     BP 01/23/23 1351 109/63     Systolic BP Percentile --      Diastolic BP Percentile --      Pulse Rate 01/23/23 1351 67     Resp 01/23/23 1351 18     Temp 01/23/23 1351 97.9 F (36.6 C)     Temp Source 01/23/23 1351 Oral     SpO2 01/23/23 1351 100 %     Weight 01/23/23 1349 173 lb (78.5 kg)     Height 01/23/23 1349 6' (1.829 m)     Head Circumference --      Peak Flow --      Pain Score 01/23/23 1348 0     Pain Loc --      Pain Education --      Exclude from Growth Chart --    No data found.  Updated Vital Signs BP 109/63 (BP Location: Right Arm)   Pulse 67   Temp 97.9 F (36.6 C) (Oral)   Resp 18   Ht 6' (1.829 m)   Wt 173 lb (78.5 kg)   SpO2 100%  BMI 23.46 kg/m   Visual Acuity Right Eye Distance:   Left Eye Distance:   Bilateral Distance:    Right Eye Near:   Left Eye Near:    Bilateral Near:     Physical Exam Vitals and nursing note reviewed.  Constitutional:      Appearance: Normal appearance.  HENT:     Head: Normocephalic and atraumatic.     Right Ear: External ear normal.     Left Ear: External ear normal.     Nose: Nose normal.     Mouth/Throat:     Mouth: Mucous membranes are moist.  Eyes:     Conjunctiva/sclera: Conjunctivae normal.  Cardiovascular:     Rate and Rhythm: Normal rate.  Pulmonary:     Effort: Pulmonary effort is normal. No respiratory distress.  Musculoskeletal:        General: Normal range of motion.  Neurological:     General: No focal deficit present.     Mental Status: He is alert and oriented to person, place, and time.  Psychiatric:        Mood and Affect: Mood normal.        Behavior: Behavior normal.      UC Treatments / Results   Labs (all labs ordered are listed, but only abnormal results are displayed) Labs Reviewed - No data to display  EKG   Radiology No results found.  Procedures Procedures (including critical care time)  Medications Ordered in UC Medications - No data to display  Initial Impression / Assessment and Plan / UC Course  I have reviewed the triage vital signs and the nursing notes.  Pertinent labs & imaging results that were available during my care of the patient were reviewed by me and considered in my medical decision making (see chart for details).  Vitals and triage reviewed, patient is hemodynamically stable.  Recent urinary frequency and potential penile discharge after unprotected intercourse.  Cytology swab obtained, staff will contact if treatment is indicated based on swab results.  Plan of care, follow-up care and return precautions given, no questions at this time.     Final Clinical Impressions(s) / UC Diagnoses   Final diagnoses:  Penile discharge  Screening examination for sexually transmitted disease     Discharge Instructions      You have been screened for gonorrhea, chlamydia and trichomoniasis.  Results will be available on MyChart and staff will contact you if positive to initiate the appropriate treatment.  Abstain from intercourse until all results have been received and notify any sexual partners of any positive results.   Return to clinic for new or urgent symptoms.    ED Prescriptions   None    PDMP not reviewed this encounter.   Dreama Ovidio SAILOR, FNP 01/23/23 1406

## 2023-01-23 NOTE — ED Triage Notes (Signed)
 Pt presents to urgent care to be tested for STD's. Pt currently denies symptoms at this time. Pt denies taking medications today as well.

## 2023-01-23 NOTE — Telephone Encounter (Signed)
 Cytology lab ordered.

## 2023-01-26 LAB — CYTOLOGY, (ORAL, ANAL, URETHRAL) ANCILLARY ONLY
Chlamydia: NEGATIVE
Comment: NEGATIVE
Comment: NEGATIVE
Comment: NORMAL
Neisseria Gonorrhea: NEGATIVE
Trichomonas: POSITIVE — AB

## 2023-01-27 ENCOUNTER — Telehealth (HOSPITAL_COMMUNITY): Payer: Self-pay

## 2023-01-27 MED ORDER — METRONIDAZOLE 500 MG PO TABS: 2000.0000 mg | ORAL_TABLET | Freq: Once | ORAL | 0 refills | Status: AC

## 2023-01-27 MED ORDER — METRONIDAZOLE 500 MG PO TABS: 2000.0000 mg | ORAL_TABLET | Freq: Once | ORAL | 0 refills | Status: DC

## 2023-01-27 NOTE — Telephone Encounter (Signed)
 Patient informed and voiced understanding. Patient requested RX to be sent to [facility] [address]. I sent the med to this pharmacy.

## 2023-01-27 NOTE — Telephone Encounter (Signed)
 2g flagyl sent to pharmacy on file

## 2023-01-27 NOTE — Telephone Encounter (Signed)
 Patient tested positive for Trich on 01/23/2023. Please advise.

## 2023-01-28 ENCOUNTER — Telehealth (HOSPITAL_COMMUNITY): Payer: Self-pay | Admitting: Emergency Medicine

## 2023-01-28 NOTE — Telephone Encounter (Signed)
 Called and discussed positive trichomoniasis results.  Patient received treatment with 2 g of Flagyl yesterday, had some nausea but was able to tolerate treatment.
